# Patient Record
Sex: Male | Born: 1955 | Race: Black or African American | Hispanic: No | Marital: Married | Smoking: Former smoker
Health system: Southern US, Community
[De-identification: ages and names within clinical notes are randomized; demographics above are authoritative.]

## PROBLEM LIST (undated history)

## (undated) DIAGNOSIS — R011 Cardiac murmur, unspecified: Secondary | ICD-10-CM

## (undated) DIAGNOSIS — D649 Anemia, unspecified: Secondary | ICD-10-CM

## (undated) DIAGNOSIS — C61 Malignant neoplasm of prostate: Secondary | ICD-10-CM

## (undated) HISTORY — DX: Anemia, unspecified: D64.9

## (undated) HISTORY — PX: HERNIA REPAIR: SHX51

## (undated) HISTORY — DX: Malignant neoplasm of prostate: C61

---

## 2006-03-16 ENCOUNTER — Emergency Department: Payer: Self-pay | Admitting: Emergency Medicine

## 2006-07-24 ENCOUNTER — Emergency Department: Payer: Self-pay | Admitting: Emergency Medicine

## 2013-11-17 ENCOUNTER — Emergency Department: Payer: Self-pay | Admitting: Emergency Medicine

## 2013-11-17 LAB — BASIC METABOLIC PANEL
ANION GAP: 8 (ref 7–16)
BUN: 10 mg/dL (ref 7–18)
CALCIUM: 8.6 mg/dL (ref 8.5–10.1)
CO2: 26 mmol/L (ref 21–32)
Chloride: 107 mmol/L (ref 98–107)
Creatinine: 1.14 mg/dL (ref 0.60–1.30)
EGFR (African American): >60
EGFR (Non-African Amer.): >60
Glucose: 111 mg/dL — ABNORMAL HIGH (ref 65–99)
OSMOLALITY: 281 (ref 275–301)
POTASSIUM: 3.6 mmol/L (ref 3.5–5.1)
Sodium: 141 mmol/L (ref 136–145)

## 2013-11-17 LAB — CBC
HCT: 42.7 % (ref 40.0–52.0)
HGB: 14.2 g/dL (ref 13.0–18.0)
MCH: 31 pg (ref 26.0–34.0)
MCHC: 33.4 g/dL (ref 32.0–36.0)
MCV: 93 fL (ref 80–100)
PLATELETS: 264 10*3/uL (ref 150–440)
RBC: 4.59 10*6/uL (ref 4.40–5.90)
RDW: 13.5 % (ref 11.5–14.5)
WBC: 6.5 10*3/uL (ref 3.8–10.6)

## 2013-11-17 LAB — TROPONIN I: Troponin-I: <0.02 ng/mL

## 2013-11-18 LAB — D-DIMER(ARMC): D-DIMER: 552 ng/mL

## 2013-11-18 LAB — CK TOTAL AND CKMB (NOT AT ARMC)
CK, Total: 468 U/L — ABNORMAL HIGH
CK-MB: 5.7 ng/mL — AB (ref 0.5–3.6)

## 2013-11-18 LAB — TROPONIN I: Troponin-I: <0.02 ng/mL

## 2014-03-23 ENCOUNTER — Ambulatory Visit: Payer: Self-pay | Admitting: Internal Medicine

## 2014-04-21 ENCOUNTER — Inpatient Hospital Stay: Payer: Self-pay | Admitting: Internal Medicine

## 2014-04-21 LAB — COMPREHENSIVE METABOLIC PANEL
ALBUMIN: 2.4 g/dL — AB (ref 3.4–5.0)
ALK PHOS: 690 U/L — AB
ANION GAP: 7 (ref 7–16)
AST: 36 U/L (ref 15–37)
BILIRUBIN TOTAL: 0.2 mg/dL (ref 0.2–1.0)
BUN: 15 mg/dL (ref 7–18)
CO2: 29 mmol/L (ref 21–32)
Calcium, Total: 9.2 mg/dL (ref 8.5–10.1)
Chloride: 98 mmol/L (ref 98–107)
Creatinine: 0.82 mg/dL (ref 0.60–1.30)
EGFR (African American): >60
EGFR (Non-African Amer.): >60
Glucose: 119 mg/dL — ABNORMAL HIGH (ref 65–99)
Osmolality: 270 (ref 275–301)
Potassium: 4.3 mmol/L (ref 3.5–5.1)
SGPT (ALT): 44 U/L
SODIUM: 134 mmol/L — AB (ref 136–145)
Total Protein: 7.4 g/dL (ref 6.4–8.2)

## 2014-04-21 LAB — CBC WITH DIFFERENTIAL/PLATELET
BANDS NEUTROPHIL: 3 %
HCT: 31.6 % — AB (ref 40.0–52.0)
HGB: 10.3 g/dL — ABNORMAL LOW (ref 13.0–18.0)
Lymphocytes: 35 %
MCH: 28 pg (ref 26.0–34.0)
MCHC: 32.6 g/dL (ref 32.0–36.0)
MCV: 86 fL (ref 80–100)
Metamyelocyte: 2 %
Monocytes: 5 %
NRBC/100 WBC: 1 /
Platelet: 417 10*3/uL (ref 150–440)
RBC: 3.67 10*6/uL — ABNORMAL LOW (ref 4.40–5.90)
RDW: 14.5 % (ref 11.5–14.5)
Segmented Neutrophils: 55 %
WBC: 8.3 10*3/uL (ref 3.8–10.6)

## 2014-04-22 LAB — IRON AND TIBC
Iron Bind.Cap.(Total): 192 ug/dL — ABNORMAL LOW (ref 250–450)
Iron Saturation: 90 %
Iron: 173 ug/dL (ref 65–175)
Unbound Iron-Bind.Cap.: 19 ug/dL

## 2014-04-22 LAB — CBC WITH DIFFERENTIAL/PLATELET
Bands: 2 %
HCT: 30.2 % — ABNORMAL LOW (ref 40.0–52.0)
HGB: 10 g/dL — ABNORMAL LOW (ref 13.0–18.0)
LYMPHS PCT: 19 %
MCH: 28.5 pg (ref 26.0–34.0)
MCHC: 33 g/dL (ref 32.0–36.0)
MCV: 86 fL (ref 80–100)
Monocytes: 10 %
NRBC/100 WBC: 8 /
PLATELETS: 411 10*3/uL (ref 150–440)
RBC: 3.5 10*6/uL — ABNORMAL LOW (ref 4.40–5.90)
RDW: 14.7 % — AB (ref 11.5–14.5)
SEGMENTED NEUTROPHILS: 69 %
WBC: 8.5 10*3/uL (ref 3.8–10.6)

## 2014-04-22 LAB — PROTIME-INR
INR: 1.1
PROTHROMBIN TIME: 14 s (ref 11.5–14.7)

## 2014-04-22 LAB — RETICULOCYTES
Absolute Retic Count: 0.07 10*6/uL (ref 0.019–0.186)
Reticulocyte: 2 % (ref 0.4–3.1)

## 2014-04-22 LAB — APTT: Activated PTT: 33.8 secs (ref 23.6–35.9)

## 2014-04-22 LAB — FERRITIN: Ferritin (ARMC): 1612 ng/mL — ABNORMAL HIGH (ref 8–388)

## 2014-04-23 LAB — OCCULT BLOOD X 1 CARD TO LAB, STOOL: Occult Blood, Feces: NEGATIVE

## 2014-04-24 ENCOUNTER — Ambulatory Visit: Payer: Self-pay | Admitting: Radiation Oncology

## 2014-04-24 LAB — CBC WITH DIFFERENTIAL/PLATELET
BANDS NEUTROPHIL: 3 %
HCT: 32.3 % — AB (ref 40.0–52.0)
HGB: 10.5 g/dL — ABNORMAL LOW (ref 13.0–18.0)
Lymphocytes: 25 %
MCH: 28.3 pg (ref 26.0–34.0)
MCHC: 32.5 g/dL (ref 32.0–36.0)
MCV: 87 fL (ref 80–100)
Metamyelocyte: 3 %
Monocytes: 5 %
Myelocyte: 2 %
NRBC/100 WBC: 2 /
Platelet: 438 10*3/uL (ref 150–440)
RBC: 3.71 10*6/uL — AB (ref 4.40–5.90)
RDW: 15 % — ABNORMAL HIGH (ref 11.5–14.5)
Segmented Neutrophils: 62 %
WBC: 10.5 10*3/uL (ref 3.8–10.6)

## 2014-04-24 LAB — PSA: PSA: 4.9 ng/mL — ABNORMAL HIGH (ref 0.0–4.0)

## 2014-04-25 LAB — CBC WITH DIFFERENTIAL/PLATELET
HCT: 32.3 % — ABNORMAL LOW (ref 40.0–52.0)
HGB: 10.6 g/dL — AB (ref 13.0–18.0)
LYMPHS PCT: 17 %
MCH: 28.5 pg (ref 26.0–34.0)
MCHC: 32.8 g/dL (ref 32.0–36.0)
MCV: 87 fL (ref 80–100)
Metamyelocyte: 3 %
Monocytes: 11 %
Myelocyte: 2 %
NRBC/100 WBC: 3 /
Platelet: 421 10*3/uL (ref 150–440)
RBC: 3.72 10*6/uL — AB (ref 4.40–5.90)
RDW: 15.3 % — ABNORMAL HIGH (ref 11.5–14.5)
Segmented Neutrophils: 67 %
WBC: 10.8 10*3/uL — ABNORMAL HIGH (ref 3.8–10.6)

## 2014-04-26 LAB — CBC WITH DIFFERENTIAL/PLATELET
Eosinophil: 1 %
HCT: 31.5 % — ABNORMAL LOW (ref 40.0–52.0)
HGB: 10.3 g/dL — ABNORMAL LOW (ref 13.0–18.0)
Lymphocytes: 25 %
MCH: 28.6 pg (ref 26.0–34.0)
MCHC: 32.8 g/dL (ref 32.0–36.0)
MCV: 87 fL (ref 80–100)
MONOS PCT: 13 %
NRBC/100 WBC: 3 /
PLATELETS: 401 10*3/uL (ref 150–440)
RBC: 3.62 10*6/uL — AB (ref 4.40–5.90)
RDW: 15.6 % — ABNORMAL HIGH (ref 11.5–14.5)
Segmented Neutrophils: 61 %
WBC: 10.4 10*3/uL (ref 3.8–10.6)

## 2014-04-26 LAB — BASIC METABOLIC PANEL
ANION GAP: 6 — AB (ref 7–16)
BUN: 17 mg/dL (ref 7–18)
CO2: 32 mmol/L (ref 21–32)
Calcium, Total: 8.8 mg/dL (ref 8.5–10.1)
Chloride: 92 mmol/L — ABNORMAL LOW (ref 98–107)
Creatinine: 0.84 mg/dL (ref 0.60–1.30)
EGFR (African American): >60
Glucose: 128 mg/dL — ABNORMAL HIGH (ref 65–99)
Osmolality: 264 (ref 275–301)
POTASSIUM: 4.3 mmol/L (ref 3.5–5.1)
SODIUM: 130 mmol/L — AB (ref 136–145)

## 2014-04-27 LAB — BASIC METABOLIC PANEL
Anion Gap: 8 (ref 7–16)
BUN: 18 mg/dL (ref 7–18)
CO2: 29 mmol/L (ref 21–32)
CREATININE: 0.77 mg/dL (ref 0.60–1.30)
Calcium, Total: 8.6 mg/dL (ref 8.5–10.1)
Chloride: 95 mmol/L — ABNORMAL LOW (ref 98–107)
EGFR (Non-African Amer.): >60
Glucose: 141 mg/dL — ABNORMAL HIGH (ref 65–99)
OSMOLALITY: 269 (ref 275–301)
POTASSIUM: 4.5 mmol/L (ref 3.5–5.1)
SODIUM: 132 mmol/L — AB (ref 136–145)

## 2014-04-27 LAB — CBC WITH DIFFERENTIAL/PLATELET
BANDS NEUTROPHIL: 4 %
HCT: 31.9 % — AB (ref 40.0–52.0)
HGB: 10.6 g/dL — ABNORMAL LOW (ref 13.0–18.0)
Lymphocytes: 21 %
MCH: 28.6 pg (ref 26.0–34.0)
MCHC: 33.3 g/dL (ref 32.0–36.0)
MCV: 86 fL (ref 80–100)
MONOS PCT: 7 %
NRBC/100 WBC: 3 /
Platelet: 363 10*3/uL (ref 150–440)
RBC: 3.71 10*6/uL — AB (ref 4.40–5.90)
RDW: 15.7 % — ABNORMAL HIGH (ref 11.5–14.5)
SEGMENTED NEUTROPHILS: 68 %
WBC: 9.4 10*3/uL (ref 3.8–10.6)

## 2014-04-28 LAB — BASIC METABOLIC PANEL
ANION GAP: 6 — AB (ref 7–16)
BUN: 19 mg/dL — ABNORMAL HIGH (ref 7–18)
CREATININE: 0.82 mg/dL (ref 0.60–1.30)
Calcium, Total: 8.7 mg/dL (ref 8.5–10.1)
Chloride: 95 mmol/L — ABNORMAL LOW (ref 98–107)
Co2: 31 mmol/L (ref 21–32)
Glucose: 167 mg/dL — ABNORMAL HIGH (ref 65–99)
Osmolality: 271 (ref 275–301)
Potassium: 4.3 mmol/L (ref 3.5–5.1)
Sodium: 132 mmol/L — ABNORMAL LOW (ref 136–145)

## 2014-04-28 LAB — CBC WITH DIFFERENTIAL/PLATELET
BANDS NEUTROPHIL: 3 %
HCT: 31.8 % — ABNORMAL LOW (ref 40.0–52.0)
HGB: 10.5 g/dL — ABNORMAL LOW (ref 13.0–18.0)
LYMPHS PCT: 9 %
MCH: 28.9 pg (ref 26.0–34.0)
MCHC: 33.2 g/dL (ref 32.0–36.0)
MCV: 87 fL (ref 80–100)
METAMYELOCYTE: 4 %
MONOS PCT: 7 %
NRBC/100 WBC: 5 /
PLATELETS: 351 10*3/uL (ref 150–440)
RBC: 3.65 10*6/uL — AB (ref 4.40–5.90)
RDW: 15.8 % — ABNORMAL HIGH (ref 11.5–14.5)
SEGMENTED NEUTROPHILS: 77 %
WBC: 10.6 10*3/uL (ref 3.8–10.6)

## 2014-05-01 ENCOUNTER — Ambulatory Visit: Payer: Self-pay | Admitting: Radiation Oncology

## 2014-05-03 LAB — CBC CANCER CENTER
BASOS ABS: 0 x10 3/mm (ref 0.0–0.1)
BASOS PCT: 0.1 %
Eosinophil #: 0 x10 3/mm (ref 0.0–0.7)
Eosinophil %: 0.1 %
HCT: 32 % — ABNORMAL LOW (ref 40.0–52.0)
HGB: 10.6 g/dL — ABNORMAL LOW (ref 13.0–18.0)
LYMPHS PCT: 12.5 %
Lymphocyte #: 1.1 x10 3/mm (ref 1.0–3.6)
MCH: 28.6 pg (ref 26.0–34.0)
MCHC: 33.1 g/dL (ref 32.0–36.0)
MCV: 86 fL (ref 80–100)
MONOS PCT: 12.3 %
Monocyte #: 1.1 x10 3/mm — ABNORMAL HIGH (ref 0.2–1.0)
NEUTROS ABS: 6.8 x10 3/mm — AB (ref 1.4–6.5)
Neutrophil %: 75 %
Platelet: 295 x10 3/mm (ref 150–440)
RBC: 3.7 10*6/uL — AB (ref 4.40–5.90)
RDW: 16.3 % — AB (ref 11.5–14.5)
WBC: 9.1 x10 3/mm (ref 3.8–10.6)

## 2014-05-04 ENCOUNTER — Emergency Department: Payer: Self-pay | Admitting: Student

## 2014-05-12 ENCOUNTER — Emergency Department: Payer: Self-pay | Admitting: Emergency Medicine

## 2014-05-12 LAB — COMPREHENSIVE METABOLIC PANEL
Albumin: 2.5 g/dL — ABNORMAL LOW (ref 3.4–5.0)
Alkaline Phosphatase: 968 U/L — ABNORMAL HIGH
Anion Gap: 5 — ABNORMAL LOW (ref 7–16)
BILIRUBIN TOTAL: 0.3 mg/dL (ref 0.2–1.0)
BUN: 14 mg/dL (ref 7–18)
CALCIUM: 7.9 mg/dL — AB (ref 8.5–10.1)
Chloride: 99 mmol/L (ref 98–107)
Co2: 31 mmol/L (ref 21–32)
Creatinine: 0.84 mg/dL (ref 0.60–1.30)
EGFR (African American): >60
Glucose: 154 mg/dL — ABNORMAL HIGH (ref 65–99)
Osmolality: 274 (ref 275–301)
Potassium: 4.1 mmol/L (ref 3.5–5.1)
SGOT(AST): 53 U/L — ABNORMAL HIGH (ref 15–37)
SGPT (ALT): 38 U/L
Sodium: 135 mmol/L — ABNORMAL LOW (ref 136–145)
Total Protein: 6.9 g/dL (ref 6.4–8.2)

## 2014-05-12 LAB — CBC CANCER CENTER
BASOS ABS: 0 x10 3/mm (ref 0.0–0.1)
Basophil %: 0.4 %
Eosinophil #: 0 x10 3/mm (ref 0.0–0.7)
Eosinophil %: 0.2 %
HCT: 34 % — ABNORMAL LOW (ref 40.0–52.0)
HGB: 10.9 g/dL — AB (ref 13.0–18.0)
LYMPHS PCT: 11.6 %
Lymphocyte #: 0.7 x10 3/mm — ABNORMAL LOW (ref 1.0–3.6)
MCH: 28.2 pg (ref 26.0–34.0)
MCHC: 32 g/dL (ref 32.0–36.0)
MCV: 88 fL (ref 80–100)
MONO ABS: 0.6 x10 3/mm (ref 0.2–1.0)
MONOS PCT: 10.4 %
Neutrophil #: 4.4 x10 3/mm (ref 1.4–6.5)
Neutrophil %: 77.4 %
PLATELETS: 229 x10 3/mm (ref 150–440)
RBC: 3.86 10*6/uL — AB (ref 4.40–5.90)
RDW: 17.5 % — AB (ref 11.5–14.5)
WBC: 5.7 x10 3/mm (ref 3.8–10.6)

## 2014-05-12 LAB — CBC
HCT: 33.1 % — AB (ref 40.0–52.0)
HGB: 10.9 g/dL — ABNORMAL LOW (ref 13.0–18.0)
MCH: 29.1 pg (ref 26.0–34.0)
MCHC: 33 g/dL (ref 32.0–36.0)
MCV: 88 fL (ref 80–100)
Platelet: 217 10*3/uL (ref 150–440)
RBC: 3.75 10*6/uL — AB (ref 4.40–5.90)
RDW: 17.3 % — AB (ref 11.5–14.5)
WBC: 5.7 10*3/uL (ref 3.8–10.6)

## 2014-05-12 LAB — BASIC METABOLIC PANEL
Anion Gap: 5 — ABNORMAL LOW (ref 7–16)
BUN: 15 mg/dL (ref 7–18)
CALCIUM: 7.8 mg/dL — AB (ref 8.5–10.1)
CO2: 32 mmol/L (ref 21–32)
Chloride: 98 mmol/L (ref 98–107)
Creatinine: 0.82 mg/dL (ref 0.60–1.30)
EGFR (African American): >60
GLUCOSE: 143 mg/dL — AB (ref 65–99)
OSMOLALITY: 273 (ref 275–301)
Potassium: 4.3 mmol/L (ref 3.5–5.1)
Sodium: 135 mmol/L — ABNORMAL LOW (ref 136–145)

## 2014-05-12 LAB — IRON AND TIBC
Iron Bind.Cap.(Total): 209 ug/dL — ABNORMAL LOW (ref 250–450)
Iron Saturation: 46 %
Iron: 97 ug/dL (ref 65–175)
Unbound Iron-Bind.Cap.: 112 ug/dL

## 2014-05-12 LAB — TROPONIN I
Troponin-I: <0.02 ng/mL
Troponin-I: <0.02 ng/mL

## 2014-05-15 LAB — PSA: PSA: 4 ng/mL (ref 0.0–4.0)

## 2014-05-23 ENCOUNTER — Ambulatory Visit: Payer: Self-pay | Admitting: Radiation Oncology

## 2014-06-05 ENCOUNTER — Ambulatory Visit: Payer: Self-pay | Admitting: Surgery

## 2014-06-08 ENCOUNTER — Ambulatory Visit: Payer: Self-pay | Admitting: Surgery

## 2014-06-09 LAB — COMPREHENSIVE METABOLIC PANEL
ANION GAP: 9 (ref 7–16)
Albumin: 2.7 g/dL — ABNORMAL LOW (ref 3.4–5.0)
Alkaline Phosphatase: 1096 U/L — ABNORMAL HIGH
BUN: 15 mg/dL (ref 7–18)
Bilirubin,Total: 0.3 mg/dL (ref 0.2–1.0)
Calcium, Total: 7.9 mg/dL — ABNORMAL LOW (ref 8.5–10.1)
Chloride: 100 mmol/L (ref 98–107)
Co2: 26 mmol/L (ref 21–32)
Creatinine: 0.81 mg/dL (ref 0.60–1.30)
EGFR (African American): >60
EGFR (Non-African Amer.): >60
Glucose: 122 mg/dL — ABNORMAL HIGH (ref 65–99)
Osmolality: 272 (ref 275–301)
Potassium: 4.3 mmol/L (ref 3.5–5.1)
SGOT(AST): 49 U/L — ABNORMAL HIGH (ref 15–37)
SGPT (ALT): 73 U/L — ABNORMAL HIGH
Sodium: 135 mmol/L — ABNORMAL LOW (ref 136–145)
TOTAL PROTEIN: 6.3 g/dL — AB (ref 6.4–8.2)

## 2014-06-09 LAB — CBC CANCER CENTER
BASOS PCT: 0.2 %
Basophil #: 0 x10 3/mm (ref 0.0–0.1)
Eosinophil #: 0 x10 3/mm (ref 0.0–0.7)
Eosinophil %: 0.1 %
HCT: 30.9 % — ABNORMAL LOW (ref 40.0–52.0)
HGB: 10.5 g/dL — ABNORMAL LOW (ref 13.0–18.0)
LYMPHS PCT: 17.9 %
Lymphocyte #: 0.9 x10 3/mm — ABNORMAL LOW (ref 1.0–3.6)
MCH: 29.7 pg (ref 26.0–34.0)
MCHC: 33.8 g/dL (ref 32.0–36.0)
MCV: 88 fL (ref 80–100)
Monocyte #: 0.6 x10 3/mm (ref 0.2–1.0)
Monocyte %: 11.5 %
NEUTROS PCT: 70.3 %
Neutrophil #: 3.4 x10 3/mm (ref 1.4–6.5)
Platelet: 158 x10 3/mm (ref 150–440)
RBC: 3.52 10*6/uL — ABNORMAL LOW (ref 4.40–5.90)
RDW: 22.1 % — AB (ref 11.5–14.5)
WBC: 4.8 x10 3/mm (ref 3.8–10.6)

## 2014-06-12 LAB — PSA: PSA: 2.9 ng/mL (ref 0.0–4.0)

## 2014-06-19 LAB — CBC CANCER CENTER
Basophil #: 0 x10 3/mm (ref 0.0–0.1)
Basophil %: 0.3 %
EOS ABS: 0 x10 3/mm (ref 0.0–0.7)
EOS PCT: 0 %
HCT: 29.4 % — ABNORMAL LOW (ref 40.0–52.0)
HGB: 9.5 g/dL — ABNORMAL LOW (ref 13.0–18.0)
Lymphocyte #: 1.4 x10 3/mm (ref 1.0–3.6)
Lymphocyte %: 12.8 %
MCH: 28.7 pg (ref 26.0–34.0)
MCHC: 32.2 g/dL (ref 32.0–36.0)
MCV: 89 fL (ref 80–100)
Monocyte #: 1.2 x10 3/mm — ABNORMAL HIGH (ref 0.2–1.0)
Monocyte %: 10.5 %
NEUTROS PCT: 76.4 %
Neutrophil #: 8.5 x10 3/mm — ABNORMAL HIGH (ref 1.4–6.5)
PLATELETS: 112 x10 3/mm — AB (ref 150–440)
RBC: 3.29 10*6/uL — AB (ref 4.40–5.90)
RDW: 23.1 % — ABNORMAL HIGH (ref 11.5–14.5)
WBC: 11.1 x10 3/mm — ABNORMAL HIGH (ref 3.8–10.6)

## 2014-06-22 LAB — CBC CANCER CENTER
BASOS ABS: 0 x10 3/mm (ref 0.0–0.1)
Basophil %: 0.3 %
EOS ABS: 0 x10 3/mm (ref 0.0–0.7)
Eosinophil %: 0.1 %
HCT: 27.2 % — AB (ref 40.0–52.0)
HGB: 8.8 g/dL — ABNORMAL LOW (ref 13.0–18.0)
Lymphocyte #: 1.2 x10 3/mm (ref 1.0–3.6)
Lymphocyte %: 10.9 %
MCH: 29.1 pg (ref 26.0–34.0)
MCHC: 32.6 g/dL (ref 32.0–36.0)
MCV: 89 fL (ref 80–100)
MONO ABS: 0.8 x10 3/mm (ref 0.2–1.0)
Monocyte %: 7.5 %
Neutrophil #: 8.7 x10 3/mm — ABNORMAL HIGH (ref 1.4–6.5)
Neutrophil %: 81.2 %
Platelet: 90 x10 3/mm — ABNORMAL LOW (ref 150–440)
RBC: 3.04 10*6/uL — AB (ref 4.40–5.90)
RDW: 23.4 % — AB (ref 11.5–14.5)
WBC: 10.7 x10 3/mm — AB (ref 3.8–10.6)

## 2014-06-23 ENCOUNTER — Ambulatory Visit: Payer: Self-pay | Admitting: Radiation Oncology

## 2014-06-26 LAB — CBC CANCER CENTER
BASOS ABS: 0 x10 3/mm (ref 0.0–0.1)
Basophil %: 0.2 %
Eosinophil #: 0 x10 3/mm (ref 0.0–0.7)
Eosinophil %: 0 %
HCT: 27.7 % — ABNORMAL LOW (ref 40.0–52.0)
HGB: 8.8 g/dL — AB (ref 13.0–18.0)
Lymphocyte #: 1.2 x10 3/mm (ref 1.0–3.6)
Lymphocyte %: 8.5 %
MCH: 29.1 pg (ref 26.0–34.0)
MCHC: 32 g/dL (ref 32.0–36.0)
MCV: 91 fL (ref 80–100)
MONO ABS: 1.1 x10 3/mm — AB (ref 0.2–1.0)
Monocyte %: 7.4 %
NEUTROS PCT: 83.9 %
Neutrophil #: 12 x10 3/mm — ABNORMAL HIGH (ref 1.4–6.5)
PLATELETS: 115 x10 3/mm — AB (ref 150–440)
RBC: 3.04 10*6/uL — ABNORMAL LOW (ref 4.40–5.90)
RDW: 24.9 % — ABNORMAL HIGH (ref 11.5–14.5)
WBC: 14.3 x10 3/mm — ABNORMAL HIGH (ref 3.8–10.6)

## 2014-07-03 LAB — COMPREHENSIVE METABOLIC PANEL
ALT: 57 U/L
Albumin: 3 g/dL — ABNORMAL LOW (ref 3.4–5.0)
Alkaline Phosphatase: 965 U/L — ABNORMAL HIGH
Anion Gap: 7 (ref 7–16)
BUN: 12 mg/dL (ref 7–18)
Bilirubin,Total: 0.3 mg/dL (ref 0.2–1.0)
CHLORIDE: 103 mmol/L (ref 98–107)
CREATININE: 0.77 mg/dL (ref 0.60–1.30)
Calcium, Total: 7.8 mg/dL — ABNORMAL LOW (ref 8.5–10.1)
Co2: 29 mmol/L (ref 21–32)
EGFR (Non-African Amer.): >60
Glucose: 140 mg/dL — ABNORMAL HIGH (ref 65–99)
Osmolality: 280 (ref 275–301)
POTASSIUM: 4.1 mmol/L (ref 3.5–5.1)
SGOT(AST): 13 U/L — ABNORMAL LOW (ref 15–37)
Sodium: 139 mmol/L (ref 136–145)
TOTAL PROTEIN: 6.1 g/dL — AB (ref 6.4–8.2)

## 2014-07-03 LAB — CBC CANCER CENTER
BASOS ABS: 0.1 x10 3/mm (ref 0.0–0.1)
Basophil %: 0.8 %
Eosinophil #: 0 x10 3/mm (ref 0.0–0.7)
Eosinophil %: 0.2 %
HCT: 28.9 % — AB (ref 40.0–52.0)
HGB: 9.5 g/dL — ABNORMAL LOW (ref 13.0–18.0)
LYMPHS ABS: 0.9 x10 3/mm — AB (ref 1.0–3.6)
Lymphocyte %: 10.1 %
MCH: 30.6 pg (ref 26.0–34.0)
MCHC: 32.8 g/dL (ref 32.0–36.0)
MCV: 93 fL (ref 80–100)
Monocyte #: 0.6 x10 3/mm (ref 0.2–1.0)
Monocyte %: 6.7 %
Neutrophil #: 7.7 x10 3/mm — ABNORMAL HIGH (ref 1.4–6.5)
Neutrophil %: 82.2 %
PLATELETS: 188 x10 3/mm (ref 150–440)
RBC: 3.09 10*6/uL — ABNORMAL LOW (ref 4.40–5.90)
RDW: 26.4 % — AB (ref 11.5–14.5)
WBC: 9.3 x10 3/mm (ref 3.8–10.6)

## 2014-07-10 LAB — CBC CANCER CENTER
Basophil #: 0 x10 3/mm (ref 0.0–0.1)
Basophil %: 0.3 %
EOS ABS: 0 x10 3/mm (ref 0.0–0.7)
Eosinophil %: 0 %
HCT: 26.8 % — ABNORMAL LOW (ref 40.0–52.0)
HGB: 8.7 g/dL — ABNORMAL LOW (ref 13.0–18.0)
Lymphocyte #: 1.4 x10 3/mm (ref 1.0–3.6)
Lymphocyte %: 9.5 %
MCH: 30.3 pg (ref 26.0–34.0)
MCHC: 32.5 g/dL (ref 32.0–36.0)
MCV: 93 fL (ref 80–100)
Monocyte #: 1.5 x10 3/mm — ABNORMAL HIGH (ref 0.2–1.0)
Monocyte %: 10.4 %
NEUTROS PCT: 79.8 %
Neutrophil #: 11.7 x10 3/mm — ABNORMAL HIGH (ref 1.4–6.5)
PLATELETS: 159 x10 3/mm (ref 150–440)
RBC: 2.87 10*6/uL — ABNORMAL LOW (ref 4.40–5.90)
RDW: 25.7 % — AB (ref 11.5–14.5)
WBC: 14.7 x10 3/mm — ABNORMAL HIGH (ref 3.8–10.6)

## 2014-07-17 LAB — CBC CANCER CENTER
BANDS NEUTROPHIL: 13 %
HCT: 23.5 % — ABNORMAL LOW (ref 40.0–52.0)
HGB: 7.7 g/dL — ABNORMAL LOW (ref 13.0–18.0)
Lymphocytes: 14 %
MCH: 30.3 pg (ref 26.0–34.0)
MCHC: 33 g/dL (ref 32.0–36.0)
MCV: 92 fL (ref 80–100)
Metamyelocyte: 1 %
Monocytes: 6 %
Myelocyte: 2 %
NRBC/100 WBC: 11 /100
PLATELETS: 140 x10 3/mm — AB (ref 150–440)
RBC: 2.56 10*6/uL — ABNORMAL LOW (ref 4.40–5.90)
RDW: 24.6 % — ABNORMAL HIGH (ref 11.5–14.5)
Segmented Neutrophils: 64 %
WBC: 8.2 x10 3/mm (ref 3.8–10.6)

## 2014-07-24 ENCOUNTER — Ambulatory Visit: Payer: Self-pay | Admitting: Radiation Oncology

## 2014-07-24 LAB — COMPREHENSIVE METABOLIC PANEL
ANION GAP: 11 (ref 7–16)
Albumin: 2.5 g/dL — ABNORMAL LOW (ref 3.4–5.0)
Alkaline Phosphatase: 1658 U/L — ABNORMAL HIGH (ref 46–116)
BILIRUBIN TOTAL: 0.2 mg/dL (ref 0.2–1.0)
BUN: 8 mg/dL (ref 7–18)
CHLORIDE: 101 mmol/L (ref 98–107)
Calcium, Total: 7.3 mg/dL — ABNORMAL LOW (ref 8.5–10.1)
Co2: 23 mmol/L (ref 21–32)
Creatinine: 0.76 mg/dL (ref 0.60–1.30)
EGFR (African American): >60
Glucose: 133 mg/dL — ABNORMAL HIGH (ref 65–99)
OSMOLALITY: 270 (ref 275–301)
Potassium: 3.7 mmol/L (ref 3.5–5.1)
SGOT(AST): 20 U/L (ref 15–37)
SGPT (ALT): 25 U/L (ref 14–63)
Sodium: 135 mmol/L — ABNORMAL LOW (ref 136–145)
Total Protein: 6.2 g/dL — ABNORMAL LOW (ref 6.4–8.2)

## 2014-07-24 LAB — CBC CANCER CENTER
Basophil #: 0.1 x10 3/mm (ref 0.0–0.1)
Basophil %: 0.6 %
Eosinophil #: 0 x10 3/mm (ref 0.0–0.7)
Eosinophil %: 0.4 %
HCT: 21.9 % — ABNORMAL LOW (ref 40.0–52.0)
HGB: 7.2 g/dL — ABNORMAL LOW (ref 13.0–18.0)
Lymphocyte #: 1.2 x10 3/mm (ref 1.0–3.6)
Lymphocyte %: 12 %
MCH: 30.4 pg (ref 26.0–34.0)
MCHC: 32.7 g/dL (ref 32.0–36.0)
MCV: 93 fL (ref 80–100)
Monocyte #: 0.9 x10 3/mm (ref 0.2–1.0)
Monocyte %: 9.5 %
Neutrophil #: 7.5 x10 3/mm — ABNORMAL HIGH (ref 1.4–6.5)
Neutrophil %: 77.5 %
Platelet: 219 x10 3/mm (ref 150–440)
RBC: 2.36 10*6/uL — ABNORMAL LOW (ref 4.40–5.90)
RDW: 23.7 % — ABNORMAL HIGH (ref 11.5–14.5)
WBC: 9.6 x10 3/mm (ref 3.8–10.6)

## 2014-07-31 LAB — CBC CANCER CENTER
BANDS NEUTROPHIL: 16 %
Eosinophil: 1 %
HCT: 27.4 % — AB (ref 40.0–52.0)
HGB: 8.9 g/dL — AB (ref 13.0–18.0)
Lymphocytes: 7 %
MCH: 30.1 pg (ref 26.0–34.0)
MCHC: 32.5 g/dL (ref 32.0–36.0)
MCV: 93 fL (ref 80–100)
MONOS PCT: 9 %
Metamyelocyte: 5 %
Myelocyte: 3 %
NRBC/100 WBC: 2 /100
Platelet: 249 x10 3/mm (ref 150–440)
RBC: 2.96 10*6/uL — AB (ref 4.40–5.90)
RDW: 22.2 % — ABNORMAL HIGH (ref 11.5–14.5)
Segmented Neutrophils: 59 %
WBC: 20.9 x10 3/mm — ABNORMAL HIGH (ref 3.8–10.6)

## 2014-07-31 LAB — COMPREHENSIVE METABOLIC PANEL
ANION GAP: 8 (ref 7–16)
Albumin: 2.9 g/dL — ABNORMAL LOW (ref 3.4–5.0)
Alkaline Phosphatase: 3040 U/L — ABNORMAL HIGH (ref 46–116)
BUN: 8 mg/dL (ref 7–18)
Bilirubin,Total: 0.3 mg/dL (ref 0.2–1.0)
CALCIUM: 7.3 mg/dL — AB (ref 8.5–10.1)
CO2: 26 mmol/L (ref 21–32)
Chloride: 107 mmol/L (ref 98–107)
Creatinine: 0.85 mg/dL (ref 0.60–1.30)
EGFR (African American): >60
Glucose: 126 mg/dL — ABNORMAL HIGH (ref 65–99)
OSMOLALITY: 281 (ref 275–301)
Potassium: 4.3 mmol/L (ref 3.5–5.1)
SGOT(AST): 24 U/L (ref 15–37)
SGPT (ALT): 47 U/L (ref 14–63)
SODIUM: 141 mmol/L (ref 136–145)
Total Protein: 6.3 g/dL — ABNORMAL LOW (ref 6.4–8.2)

## 2014-08-22 ENCOUNTER — Ambulatory Visit
Admit: 2014-08-22 | Disposition: A | Discharge status: Home / Self Care | Payer: Self-pay | Attending: Internal Medicine | Admitting: Internal Medicine

## 2014-08-31 ENCOUNTER — Observation Stay: Payer: Self-pay | Admitting: Internal Medicine

## 2014-09-14 LAB — CBC CANCER CENTER
BANDS NEUTROPHIL: 8 %
HCT: 30.3 % — ABNORMAL LOW (ref 40.0–52.0)
HGB: 9.7 g/dL — AB (ref 13.0–18.0)
LYMPHS PCT: 9 %
MCH: 30.2 pg (ref 26.0–34.0)
MCHC: 31.8 g/dL — ABNORMAL LOW (ref 32.0–36.0)
MCV: 95 fL (ref 80–100)
METAMYELOCYTE: 1 %
MONOS PCT: 6 %
Platelet: 219 x10 3/mm (ref 150–440)
RBC: 3.19 10*6/uL — AB (ref 4.40–5.90)
RDW: 19.7 % — ABNORMAL HIGH (ref 11.5–14.5)
SEGMENTED NEUTROPHILS: 76 %
WBC: 27.5 x10 3/mm — AB (ref 3.8–10.6)

## 2014-09-21 ENCOUNTER — Emergency Department: Admit: 2014-09-21 | Disposition: A | Discharge status: Home / Self Care | Payer: Self-pay | Admitting: Internal Medicine

## 2014-09-21 LAB — CBC CANCER CENTER
Basophil #: 0 x10 3/mm (ref 0.0–0.1)
Basophil %: 0.1 %
EOS PCT: 0 %
Eosinophil #: 0 x10 3/mm (ref 0.0–0.7)
HCT: 28.2 % — ABNORMAL LOW (ref 40.0–52.0)
HGB: 9 g/dL — ABNORMAL LOW (ref 13.0–18.0)
Lymphocyte #: 1.5 x10 3/mm (ref 1.0–3.6)
Lymphocyte %: 8.5 %
MCH: 30.7 pg (ref 26.0–34.0)
MCHC: 32 g/dL (ref 32.0–36.0)
MCV: 96 fL (ref 80–100)
Monocyte #: 0.9 x10 3/mm (ref 0.2–1.0)
Monocyte %: 5.2 %
Neutrophil #: 15.3 x10 3/mm — ABNORMAL HIGH (ref 1.4–6.5)
Neutrophil %: 86.2 %
Platelet: 160 x10 3/mm (ref 150–440)
RBC: 2.94 10*6/uL — AB (ref 4.40–5.90)
RDW: 19.5 % — ABNORMAL HIGH (ref 11.5–14.5)
WBC: 17.8 x10 3/mm — AB (ref 3.8–10.6)

## 2014-09-21 LAB — POTASSIUM: POTASSIUM: 4.5 mmol/L

## 2014-09-21 LAB — PHOSPHORUS: PHOSPHORUS: 2.8 mg/dL

## 2014-09-21 LAB — CALCIUM: Calcium, Total: 7.6 mg/dL — ABNORMAL LOW

## 2014-09-21 LAB — CREATININE, SERUM
CREATININE: 0.52 mg/dL — AB
Creatine, Serum: 0.52

## 2014-09-22 ENCOUNTER — Ambulatory Visit
Admit: 2014-09-22 | Disposition: A | Discharge status: Home / Self Care | Payer: Self-pay | Attending: Internal Medicine | Admitting: Internal Medicine

## 2014-09-28 LAB — COMPREHENSIVE METABOLIC PANEL
ALBUMIN: 3.8 g/dL
ALT: 24 U/L
AST: 22 U/L
Alkaline Phosphatase: 998 U/L — ABNORMAL HIGH
Anion Gap: 8 (ref 7–16)
BUN: 13 mg/dL
Bilirubin,Total: 0.4 mg/dL
Calcium, Total: 7.9 mg/dL — ABNORMAL LOW
Chloride: 107 mmol/L
Co2: 22 mmol/L
Creatinine: 0.6 mg/dL — ABNORMAL LOW
EGFR (African American): >60
EGFR (Non-African Amer.): >60
GLUCOSE: 197 mg/dL — AB
POTASSIUM: 4.2 mmol/L
SODIUM: 137 mmol/L
Total Protein: 6.6 g/dL

## 2014-09-28 LAB — CBC CANCER CENTER
BASOS ABS: 0 x10 3/mm (ref 0.0–0.1)
Basophil %: 0.3 %
Eosinophil #: 0 x10 3/mm (ref 0.0–0.7)
Eosinophil %: 0.2 %
HCT: 29.6 % — ABNORMAL LOW (ref 40.0–52.0)
HGB: 9.5 g/dL — ABNORMAL LOW (ref 13.0–18.0)
LYMPHS PCT: 9 %
Lymphocyte #: 0.9 x10 3/mm — ABNORMAL LOW (ref 1.0–3.6)
MCH: 30.9 pg (ref 26.0–34.0)
MCHC: 32.2 g/dL (ref 32.0–36.0)
MCV: 96 fL (ref 80–100)
Monocyte #: 0.3 x10 3/mm (ref 0.2–1.0)
Monocyte %: 2.9 %
NEUTROS PCT: 87.6 %
Neutrophil #: 8.9 x10 3/mm — ABNORMAL HIGH (ref 1.4–6.5)
Platelet: 230 x10 3/mm (ref 150–440)
RBC: 3.09 10*6/uL — ABNORMAL LOW (ref 4.40–5.90)
RDW: 19.7 % — AB (ref 11.5–14.5)
WBC: 10.2 x10 3/mm (ref 3.8–10.6)

## 2014-10-12 LAB — CBC CANCER CENTER
BASOS ABS: 0 x10 3/mm (ref 0.0–0.1)
BASOS PCT: 0.3 %
Eosinophil #: 0 x10 3/mm (ref 0.0–0.7)
Eosinophil %: 0 %
HCT: 29.9 % — AB (ref 40.0–52.0)
HGB: 9.5 g/dL — AB (ref 13.0–18.0)
Lymphocyte #: 0.8 x10 3/mm — ABNORMAL LOW (ref 1.0–3.6)
Lymphocyte %: 7.5 %
MCH: 30.4 pg (ref 26.0–34.0)
MCHC: 31.6 g/dL — AB (ref 32.0–36.0)
MCV: 96 fL (ref 80–100)
MONO ABS: 0.6 x10 3/mm (ref 0.2–1.0)
Monocyte %: 5.2 %
Neutrophil #: 9.7 x10 3/mm — ABNORMAL HIGH (ref 1.4–6.5)
Neutrophil %: 87 %
Platelet: 203 x10 3/mm (ref 150–440)
RBC: 3.12 10*6/uL — ABNORMAL LOW (ref 4.40–5.90)
RDW: 19.6 % — ABNORMAL HIGH (ref 11.5–14.5)
WBC: 11.1 x10 3/mm — ABNORMAL HIGH (ref 3.8–10.6)

## 2014-10-12 LAB — COMPREHENSIVE METABOLIC PANEL
ALK PHOS: 942 U/L — AB
ALT: 28 U/L
ANION GAP: 6 — AB (ref 7–16)
Albumin: 3.7 g/dL
BUN: 18 mg/dL
Bilirubin,Total: 0.5 mg/dL
CALCIUM: 8.3 mg/dL — AB
CHLORIDE: 107 mmol/L
CO2: 25 mmol/L
Creatinine: 0.79 mg/dL
EGFR (African American): >60
EGFR (Non-African Amer.): >60
GLUCOSE: 120 mg/dL — AB
Potassium: 3.9 mmol/L
SGOT(AST): 17 U/L
SODIUM: 138 mmol/L
Total Protein: 6.6 g/dL

## 2014-10-14 NOTE — Consult Note (Signed)
   Comments   Pt back from radiation and currently in no pain. Still on hydromorphone drip. Plan to start conversion from PCA to home regimen tomorrow AM.  Electronic Signatures: Mackenzy Eisenberg, Judith Part (NP)  (Signed [phone] 15:29)  Authored: Palliative Care Phifer, Izora Gala (MD)  (Signed [phone] 17:01)  Authored: Palliative Care   Last Updated: 05-Nov-15 17:01 by Phifer, Izora Gala (MD)

## 2014-10-14 NOTE — Op Note (Signed)
PATIENT NAME:  Anthony [name], Anthony [name] MR#:  [id number] DATE OF BIRTH:  1955-11-06  DATE OF PROCEDURE:  06/08/2014  PREOPERATIVE DIAGNOSIS:  Prostate cancer.   POSTOPERATIVE DIAGNOSIS:  Prostate cancer.   PROCEDURE:  Insertion of central venous catheter with subcutaneous infusion port.   SURGEON:  Loreli Dollar, MD   ANESTHESIA:  Local 1% Xylocaine followed by Xylocaine with epinephrine with monitored anesthesia care.   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   DESCRIPTION OF PROCEDURE:  The patient was placed on the operating table in the supine position under intravenous sedation and was monitored by the anesthesia staff. A rolled sheet was placed behind the shoulder blades so that the neck was extended and the head was turned some 20 degrees to the left. The neck was examined with ultrasound. I identified a large patent right internal jugular vein adjacent to the carotid artery. Next, the neck and right subclavian areas were prepared with ChloraPrep and draped in a sterile manner.   The skin beneath the clavicle was infiltrated with 1% Xylocaine. A transversely-oriented 3 cm incision was made and carried down through the subcutaneous tissues. Numerous small bleeding points were cauterized. A subcutaneous pouch was created just anterior to the deep fascia, large enough to admit the Worthington port. Several additional small bleeding points were cauterized. The tissues were infiltrated with Xylocaine with epinephrine for better hemostasis.   The jugular vein was further demonstrated with ultrasound, and the skin overlying the jugular vein was infiltrated with 1% Xylocaine with epinephrine. A lancing 5 mm incision was made, oriented transversely, and I used a hemostat to dissect down into the subcutaneous tissues. Next, with the ultrasound guidance, a needle was inserted into the  internal jugular vein. A guidewire was inserted into the central circulation. An ultrasound image was saved for the paper chart. Next, fluoroscopy was used to demonstrate the location of the guidewire in the vena cava. The dilator and introducer sheath were advanced over the guidewire. The guidewire and dilator were removed. The catheter was advanced down through the sheath, and the sheath was peeled away. The tip of the catheter was positioned in the superior vena cava at 15 cm from the skin incision. This was demonstrated with fluoroscopy, and a fluoroscopic image was saved for the paper chart. Next, I noted that there was some bleeding from the site, and the patient was placed in reverse Trendelenburg position. Next, the catheter was tunneled down to the subclavian incision, and pressure was held over the catheter site. Next, the catheter was cut to fit and attached to the Hemlock port using the accompanying sleeve to secure it. Next, the wound was further inspected and determined hemostasis was intact. The port was placed into the subcutaneous pouch and was accessed with a Huber needle, aspirated a trace of blood, and flushed with 10 mL of heparinized saline solution. Next, the port was secured to the deep fascia with 4-0 silk suture. The wounds were further inspected and determined hemostasis was intact. The subcutaneous tissues were closed with interrupted 5-0 Vicryl. Both skin incisions were closed with 5-0 Vicryl subcuticular suture and LiquiBand.   The patient appeared to tolerate the procedure satisfactorily and was carried to the operating room in the reverse Trendelenburg position.     ____________________________ Lenna Sciara. Rochel Brome, MD jws:nb D: 06/08/2014 12:49:28 ET T: 06/08/2014 23:24:29 ET JOB#: [id number]  cc: Loreli Dollar,  MD, <Dictator> Loreli Dollar MD ELECTRONICALLY SIGNED 06/09/2014 9:28

## 2014-10-14 NOTE — Consult Note (Signed)
PATIENT NAME:  Anthony [name], Anthony [name] MR#:  [id number] DATE OF BIRTH:  [birth year]  DATE OF CONSULTATION:  04/22/2014  REFERRING PHYSICIAN:   CONSULTING PHYSICIAN:  Simonne Come. Inez Pilgrim, MD  HISTORY OF PRESENT ILLNESS: Anthony [name] is a 59 year old patient who was admitted on October 30th. I saw and evaluated him on October 31st. A note was placed on the chart, communicated with primary medicine and orders entered. Narrative is delayed and rendered at this time.   Anthony [name] was admitted with back pain, MRI evidence and CT evidence of diffuse cancer in the pelvic lymph nodes and in the back, epidural disease below the conus, leg weakness from right nerve root compression. His history started a few months ago. Has had a 30 pound weight loss and appetite loss over a few months. He has had progressive back pain over a month or two. He has seen a chiropractor for manipulations. He recently saw primary doctor, Dr. Ginette Pitman, this was 4 days prior to admission, he had a PSA that was 5.7, and he was put on cyclobenzaprine and prednisone 40 mg. The patient's symptoms got worse and he was sent for an MRI, which shows extensive disease. He was hospitalized for intravenous narcotics for intractable pain and for evaluation and further treatment of the findings.   PAST MEDICAL HISTORY: Negative. His recent history includes the elevated PSA and back pain. He has had waxing and waning shoulder pain also and orthopedics has seen him this hospitalization. His severe pain in the last few months has been low back and right sacroiliac and he has some pain radiating down the sciatic fashion. He has had some weakness in flexion of the right hip.   MEDICATIONS: Recently prednisone and cyclobenzaprine. Otherwise, he has been on aspirin 325 daily, vitamin D weekly, Norco recently p.r.n. for pain and Aleve p.r.n. for pain.   SYSTEM REVIEW: On my evaluation, the patient when seen, otherwise had no headache or dizziness. No chills or  sweats. He has not had any coughing or wheezing or shortness of breath. No abdominal pain, nausea, vomiting, diarrhea, but he has admitted to poor appetite and weight loss. He has had no dysuria or hematuria or significant urinary frequency. The back and bone pain as described, also shoulder pain recently. No edema. No rash or bruising. He has had weakness of the right leg, particularly in flexing the right hip. He has had no other focal weakness. No polyuria or polydipsia.   PHYSICAL EXAMINATION:  GENERAL: He is alert and cooperative. He was in pain.  VITAL SIGNS: Stable.  HEENT: Sclerae no jaundice. Mouth: No thrush.  LYMPHATICS: No palpable lymph nodes in the neck, supraclavicular, submandibular or axilla.  LUNGS: Clear with decreased air entry. No wheezing or rales.  ABDOMEN: Nontender. No palpable mass or organomegaly.  EXTREMITIES: No extremity edema. NEUROLOGICAL: Alert and cooperative. Cranial nerves intact. Full movement and full strength and movement in the upper extremities. The right leg with some effort dependent weakness but he also has weakness in flexion of the hip.   ADMISSION LABORATORY STUDIES: White count was 8.3, hemoglobin was 10, platelets were 417,000. Reportedly a PSA recently was 5.7, that was not available initially. The creatinine was 0.82 on admission. Calcium was 9.2, alkaline phosphatase 690. SGOT and SGPT normal. Albumin of 2.4. Hemoglobin was 10.3, white count 8.3, platelets 417,000.   CT scan of the chest, abdomen and pelvis performed, showing unremarkable chest except for diffuse bony metastatic disease. CT of abdomen and pelvis showed some cysts  in the left kidney, significant retroperitoneal adenopathy, the aorta is displaced anteriorly by adenopathy that is 4.8 cm in greatest dimension. Adenopathy is into the left iliac node chain but not into the right. The MRI of the lumbar spine, shows diffuse abnormal signal throughout the lumbosacral spine. Retroperitoneal  adenopathy noted. He has epidural extension of soft tissue mass from the L3 vertebral body. There is mass effect on the right intraspinal L4 nerve root and severe spinal stenosis at L3. There is mild epidural extension of tumor from the posterior margin of L1 vertebral body with flattening of the ventral thecal sac. There is no evidence of cord compression.   IMPRESSION AND PLAN: The patient has severe retroperitoneal left iliac adenopathy and diffuse bony metastatic disease including epidural extension disease, but no spinal cord compression. Also, the patient's weakness in the right leg is already somewhat improved after a dose of steroid on admission. He still had significant pain on IV morphine and pain medicines are adjusted. He has an anemia, with a hemoglobin of 10 grams, compatible with advanced cancer. He has iron studies that show a very high ferritin of 1600, iron saturation of 90, which could be spurious due to hemolysis or incidentally discovered hemochromatosis. This should be  reviewed at a later time. Alkaline phosphatase is very high consistent with the bony metastatic disease. Calcium and creatinine are okay.   PLAN: PSA was repeated. If the patient has a very high PSA, then he would not need a tissue diagnosis, this would be established prostate cancer, but otherwise to consider prostate cancer or other histologies such as lymphoma or undifferentiated cancer, the patient would need tissue biopsy. Therefore, we will review the outstanding PSA and consult invasive radiology for site, looks like multiple sites for bone or lymph node would be amenable to biopsy. The baseline PT and PTT is okay. With regard to pain, initially titrating by continuing IV morphine p.r.n., stop Percocet, start on oxycodone more frequently p.r.n., added Tylenol around the clock, started Decadron for pain and for nerve root compression 4 mg 3 times a day. Can titrate narcotics p.r.n. If needed, can add Toradol or  nonsteroidals. Treatment depends on histology. If we confirm prostate cancer, would start Casodex as initial treatment and the Lupron. We will consult radiation/oncology also.    ____________________________ Simonne Come. Inez Pilgrim, MD rgg:TT D: 04/23/2014 15:32:00 ET T: 04/23/2014 20:28:27 ET JOB#: [id number]  cc: Simonne Come. Inez Pilgrim, MD, <Dictator> Dallas Schimke MD ELECTRONICALLY SIGNED 05/28/2014 22:01

## 2014-10-14 NOTE — Consult Note (Signed)
Reason for Visit: This 59 year old [name] patient presents to the clinic for initial evaluation of  skeletal metastasis .   Referred by Dr. Cynda Acres.  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 He has significant pain in his lower extremities and significant disease in his lumbar spine and SI joints for palliative radiation to those regions.  Pathology Report pathology report pending biopsy this week   Imaging Report CT scans MRI scans reviewed bone scan ordered   Referral Report clinical notes reviewed   Planned Treatment Regimen prior radiation therapy to lumbar spine and SI joints   HPI   patient is a 59 year old [name] admitted to [facility] with increasing low back pain radicular type pain in his right lower extremity and a 30 pound weight loss over the past several months. He been seeing a chiropractor for treatment had a PSA5.7. MRI scan showed extensive disease of his lumbar spine and SI joints consistent with metastatic disease. He has been treated with narcotic analgesics with some good pain relief. I've been asked to evaluate the patient for possible palliative radiation therapy. He has no evidence of cord compression does have some extension of diseaseinto the epidural space from soft tissue mass at the L3 vertebral body. There is also mass effect on the right intraspinal L4 nerve root. CT scan showed a constellation of finding showing bony metastatic disease significant left pelvic and retroperitoneal adenopathy suggestive of prostate cancer. Bone scan confirmed widespread metastatic disease of the skeletal axis.  Allergies:   No Known Allergies:   Other Results:  Radiology Results: LabUnknown:    30-Oct-15 20:10, CT Chest, Abd, and Pelvis With Contrast  PACS Image     02-Nov-15 14:01, Bone Scan Whole Body (Part 2 of 2)  PACS Image   MRI:    30-Oct-15  16:01, MRI Lumbar Spine WWO  MRI Lumbar Spine WWO   REASON FOR EXAM:    Low back pain w radiculopathy  COMMENTS:       PROCEDURE: MMR - MMR LUMBAR SPINE WO/W  - Apr 21 2014  4:01PM     CLINICAL DATA:  Right leg pain, low back pain, numbness, tingling    EXAM:  MRI LUMBAR SPINE WITHOUT AND WITH CONTRAST    TECHNIQUE:  Multiplanar and multiecho pulse sequences of the lumbar spine were  obtained without and with intravenous contrast.    CONTRAST:  18 mL MultiHance  COMPARISON:  None.    FINDINGS:  The vertebral bodies of the lumbar spine are normal in size. The  vertebral bodies of the lumbar spine are normal in alignment. The  intervertebral disc spaces are well-maintained.    There is diffuse abnormal signal throughout the lumbosacral spine  and visualized ilium most consistent with metastatic disease. There  is retroperitoneal lymphadenopathy with the largest left para-aortic  lymph node measuring 3.9 cm in short axis. There is an enlarged left  iliac chain lymph node measuring 2.8 cm in short axis. There is mild  epidural extension of tumor from the posterior margin of the L1  vertebral body with flattening of the ventral thecal sac. There is  epidural extension of soft tissue mass from the L2 vertebral body  narrowing the lateral recesses bilaterally.    There is epidural extension of soft tissue mass from the L3  vertebral body with a central/right paracentral predominance which  extends into  the right infra pedicular aspect of the L3-4 foramen.  There is mass effect on the right intraspinal L4 nerve root. There  is severe spinal stenosis focally at L3. There is extraosseous  extension of soft tissue mass from the right L3 pedicle posteriorly  tracking along the lamina.    The spinal cord is normal in signal and contour. The cord terminates  normally at L1 . The nerve roots of the cauda equina and the filum  terminale are normal.    The visualized portions of the SI  joints are unremarkable.  The imaged intra-abdominal contents are unremarkable.    T12-L1: No significant disc bulge. No evidence of neural foraminal  stenosis. No central canal stenosis.    L1-L2: No significant disc bulge. No evidence of neural foraminal  stenosis. No central canal stenosis.    L2-L3: No significant disc bulge. No evidence of neural foraminal  stenosis. No central canal stenosis.    L3-L4: No significant disc bulge. There is right infra pedicular  foraminal stenosis resulting from extraosseous extension of soft  tissue mass from the right L3 pedicle. There is severe spinal  stenosis focally at L3 resulting from epidural extension oftumor  from the L3 vertebral body.    L4-L5: Mild broad-based disc osteophyte complex. No evidence of  neural foraminal stenosis. No central canal stenosis.    L5-S1: No significant disc bulge. No evidence of neural foraminal  stenosis. No central canal stenosis.     IMPRESSION:  1. Diffuse abnormal marrow lesions throughout the lower thoracic and  lumbosacral spine as well as the visualized ilium consistent with  metastatic disease. There is extensive retroperitoneal and left  iliac chain lymphadenopathy. The overall appearances most consistent  with metastatic disease. Primary consideration would be for  metastatic prostate cancer given the distribution. Correlate with  PSA.  2. There is epidural extension of soft tissue mass from the L3  vertebral body with a central/right paracentral predominance which  extends into the right infra pedicular aspect of the L3-4 foramen.  There is mass effect on the right intraspinal L4 nerve root. There  is severe spinal stenosis focally at L3. Oncology consultation is  recommended.  3. There is epidural extension of soft tissue mass from the L2  vertebral body and to a lesser degree the L1 vertebral body without  spinal canal compromise.  These results were called by telephone at the time of  interpretation  on 04/21/2014 at 4:37 pm to [facility], who verbally acknowledged  these results.    Electronically Signed    By: Kathreen Devoid    On: 04/21/2014 16:38         Verified By: Jennette Banker, M.D., MD  CT:    30-Oct-15 20:10, CT Chest, Abd, and Pelvis With Contrast  CT Chest, Abd, and Pelvis With Contrast   REASON FOR EXAM:    (1) metastatic cancer; (2) metastatic cancer  COMMENTS:       PROCEDURE: CT  - CT CHEST ABDOMEN AND PELVIS W  - Apr 21 2014  8:10PM     CLINICAL DATA:  Abnormal MRI with extensive metastatic bony disease  and lymphadenopathy    EXAM:  CT CHEST, ABDOMEN, AND PELVIS WITH CONTRAST    TECHNIQUE:  Multidetector CT imaging of the chest, abdomen and pelvis was  performed following the standard protocol during bolus  administration of intravenous contrast.  CONTRAST:  100 mL Isovue-300    COMPARISON:  None.    FINDINGS:  CT CHEST FINDINGS    The lungs are well aerated bilaterally without evidence of focal  infiltrate or sizable effusion. No parenchymal nodules are seen. The  thyroid is within normal limits. The thoracic aorta and pulmonary  artery as visualized are unremarkable. No hilar or mediastinal  adenopathy is noted. The osseous structures however show significant  bony metastatic disease similar to that seen on recent MRI of the  lumbar spine.  CT ABDOMEN AND PELVIS FINDINGS    The liver, gallbladder, spleen, adrenal glands and pancreas are all  normal in their CT appearance. The kidneys demonstrate a normal  enhancement pattern with the exception of a few cysts in the left  kidney. The largest of these measures 3.6 cm in greatest dimension.  No renal calculi or urinary tract obstructive changes are noted.    Significant retroperitoneal lymphadenopathy is identified in a  periaortic and inter aortocaval regions. The adenopathy displaces  the aorta anteriorly and measures 4.8 x 3.5 cm in greatest dimension  but extends  throughout the entire retroperitoneum and into the left  iliac nodal chain. No significant right-sided adenopathy is noted.    The appendix is well visualized and within normal limits. No focal  colonic abnormality is seen. No obstructive changes are noted. The  bladder is well distended. The prostate indents upon the bladder  mildly. A few small pelvic lymph nodes are identified in the  internal iliac chain. The bony structures again demonstrate diffuse  metastatic disease similar to that seen on the recent MRI  examination. Evaluation of these lesions is that is performed on  that exam.     IMPRESSION:  Constellation of findings with bony metastatic disease and  significant left pelvic and retroperitoneal adenopathy suggestive of  prostate carcinoma. Correlation with patient's PSA is recommended.      Electronically Signed    By: Inez Catalina M.D.    On: 04/21/2014 20:22         Verified By: Everlene Farrier, M.D.,  Nuclear Med:    [phone] 14:01, Bone Scan Whole Body (Part 2 of 2)  Bone Scan Whole Body (Part 2 of 2)   REASON FOR EXAM:    part 2  COMMENTS:       PROCEDURE: NM  - NM BONE WB 3 HR 2 OF 2  - Apr 24 2014  2:01PM     CLINICAL DATA:  Osseous metastatic disease.    EXAM:  NUCLEAR MEDICINE WHOLE BODY BONE SCAN    TECHNIQUE:  Whole body anterior and posterior images were obtained approximately  3 hours after intravenous injection of radiopharmaceutical.    RADIOPHARMACEUTICALS:  21.638 mCi Technetium-99 MDP  COMPARISON:  CT scan of April 21, 2014.    FINDINGS:  Multiple foci of abnormal uptake are noted throughout the spine,  ribs, and pelvis. Abnormal uptake is also seen involving both  glenoid regions, left greater than right, consistent with metastatic  disease. Lesions are also noted in the sternum. No areas of abnormal  uptake are noted in theskull or long bones.     IMPRESSION:  Multiple foci of abnormal uptake are seen involving the spine,  ribs  and pelvis consistent with metastatic disease.      Electronically Signed    By: Sabino Dick M.D.    On: 04/24/2014 14:13         Verified JG:OTLXB Blanchie Serve, M.D.,   Relevent Results:   Relevant Scans and Labs MRI scans, bone scan, CT  scans all reviewed   Assessment and Plan: Impression:   widespread metastatic diseasemostly confined to skeletal sites as well as para aortic lymphadenopathy in patient with elevated PSA most likely prostate cancer. Patient to have biopsy-of para-aortic nodes later this week for palliative radiation therapy to his L spine and SI joints Plan:   at this time O head with palliative radiation therapy to his lumbar spine and SI joints. Would treat both areas to 33,000 cGy in 15 fractions. Would use a hyperfractionated course sinceIof the significant amount of small bowel and colon in her treatment fields. I will start treatment planning tomorrow awaiting tissue information to begin actual treatments. Risks and benefits of treatment including possible diarrhea dysuria S fatigue alteration of blood counts were explained in detail to the patient. I have set him up and ordered CT simulation tomorrow.  would like to take this opportunity for allowing me to participate in the care of yourpatient..  Fax to Physician:  Physicians To Recieve Fax:    Tama High, MD - [phone] Rusty Aus, MD - [phone].  Electronic Signatures: Armstead Peaks (MD)  (Signed [phone] 15:16)  Authored: HPI, Diagnosis, Allergies, Home Meds, Other Results, Relevent Results, Encounter Assessment and Plan, Fax to Physician   Last Updated: 02-Nov-15 15:16 by Armstead Peaks (MD)

## 2014-10-14 NOTE — Consult Note (Signed)
Chief Complaint:  Subjective/Chief Complaint Pain better when seen earlier   VITAL SIGNS/ANCILLARY NOTES: **Vital Signs.:   02-Nov-15 15:58  Vital Signs Type Routine  Temperature Temperature (F) 97.6  Celsius 36.4  Temperature Source oral  Pulse Pulse 106  Respirations Respirations 20  Systolic BP Systolic BP 782  Diastolic BP (mmHg) Diastolic BP (mmHg) 75  Mean BP 90  Pulse Ox % Pulse Ox % 100  Pulse Ox Activity Level  At rest  Oxygen Delivery Room Air/ 21 %   Brief Assessment:  GEN no acute distress   Cardiac Regular   Respiratory normal resp effort   Lab Results: Routine Chem:  02-Nov-15 05:52   Result Comment nrbc - SLIDE PREVIOUSLY REVIEWED BY PATHOLOGIST  Result(s) reported on 24 Apr 2014 at 06:38AM.  Routine Hem:  02-Nov-15 05:52   WBC (CBC) 10.5  RBC (CBC)  3.71  Hemoglobin (CBC)  10.5  Hematocrit (CBC)  32.3  Platelet Count (CBC) 438  MCV 87  MCH 28.3  MCHC 32.5  RDW  15.0  Bands 3  Segmented Neutrophils 62  Lymphocytes 25  Monocytes 5  Metamyelocyte 3  Myelocyte 2  NRBC 2  Diff Comment 1 ANISOCYTOSIS  Diff Comment 2 POLYCHROMASIA  Diff Comment 3 PLTS VARIED IN SIZE  Result(s) reported on 24 Apr 2014 at 06:38AM.   Assessment/Plan:  Assessment/Plan:  Assessment diffuse retroperitoneal adenopathy, bone disease,, epidural disease, no spinal cord compression...psa only minimally elevated, scans most consistent with prostate casncer,would be sa poorly differentiated and poor prosnostic variety, other histologies, lymphoma, in thr differental   Plan Discussed with interventional radiology earlier todasy, scan was reviewed , recommended best site for bx left sided retroperitoneal node, core bx, for flow cytometry and histology   Electronic Signatures: Dallas Schimke (MD)  (Signed [phone] 23:56)  Authored: Chief Complaint, VITAL SIGNS/ANCILLARY NOTES, Brief Assessment, Lab Results, Assessment/Plan   Last Updated: 02-Nov-15 23:56 by Dallas Schimke (MD)

## 2014-10-14 NOTE — Consult Note (Signed)
Brief Consult Note: Diagnosis: Left shoulder pain likely bursitis/rotator cuff tendonitis.   Patient was seen by consultant.   Consult note dictated.   Recommend further assessment or treatment.   Comments: Patient's shoulder pain likely due to inflammation (tendonitis/bursitis).  Recommend a NSAID or steroid taper if not medically contra-indicated.  Also recommend PT evaluation and treatment.  If he does not improve on this treatment regimen, an MRI would be warranted to determine if he has a rotator cuff tear.  Electronic Signatures: Thornton Park (MD)  (Signed 31-Oct-15 15:31)  Authored: Brief Consult Note   Last Updated: 31-Oct-15 15:31 by Thornton Park (MD)

## 2014-10-14 NOTE — Consult Note (Signed)
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   Patient was seen by consultant.   Comments: DICTATED NOTE TO FOLLOW... 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 PSA PENDING. HX PSA ELEVATION BUT LEVEL UNKNOWN.  NO SPINAL CORD COMPRESSION, THERE IS EPIDURAL DISEASE BELOW THE CONUS AND NERVE ROOT INVOLVEMENT.  SYMPTOMS IMPROVED SINCE YETERDAY.  IF VERY HIGH PSA DO NOT NEED TISSUE BX..WOULD THEN START CASODEX 50 MG PO DAILY, 2 WEEKS LATER START LUPRON. OTHERWISE, WOULD W/U FOR LYMPHOMA, MYELOMA, UNDIFFERENTIATED CANCER.  CONSULT RADIATION ONCOLOGY IN AM. PAIN MEDS ADJUSTED. IF NOT WELL CONTROLLED, TITRATE PRN, ALSO TORADOL MAY BE VERY EFFECTIVE FOR BONE PAIN.Marland Kitchen ALSO NOTED HIGH IRON SAT AND FERRITIN, MAY BE DUE TO HEMOLYSIS AND ACUTE PHASE REACTANT, OR INCIDENTIALLY DISCIVERED HEMOCHROMATOSIS...CAN LATER REPEAT IRON STUDIES AND GENETIC TESTING PRN.  Electronic Signatures: Dallas Schimke (MD)  (Signed 31-Oct-15 14:31)  Authored: Brief Consult Note   Last Updated: 31-Oct-15 14:31 by Dallas Schimke (MD)

## 2014-10-14 NOTE — Consult Note (Signed)
PATIENT NAME:  Anthony [name], Anthony [name] MR#:  [id number] DATE OF BIRTH:  [birth year]  DATE OF CONSULTATION:  04/21/2014  REFERRING PHYSICIAN:   CONSULTING PHYSICIAN:  Timoteo Gaul, MD  ORTHOPEDIC CONSULT  REASON FOR CONSULTATION: Left shoulder pain.   HISTORY OF PRESENT ILLNESS: Anthony [name] is a 59 year old male who was admitted with progressive low back pain. He has a history of an elevated PSA and a 30 pound weight loss over the past several months according to the H and P. The patient's pain has been steadily progressive and is no longer controlled with narcotic medication. He received an MRI, revealing metastatic lesions throughout the lumbar vertebrae, with a lesion that seems to be within the spinal canal. He is admitted to the medical service for pain control. I am consulted for the patient's complaints of left shoulder pain.   PAST MEDICAL HISTORY INCLUDES: History of elevated PSA, history of right-sided back pain and right-sided sciatica.   MEDICATIONS INCLUDE: Aspirin 325 mg daily, Vitamin D 50,000 units p.o. q. week, Norco 5/325 mg 1 tablet p.o. q. 6 hours p.r.n. for pain, and Aleve 240 mg p.o. b.i.d. p.r.n. for pain.   ALLERGIES: No known drug allergies.   PHYSICAL EXAMINATION:  The patient is lying supine in bed. His daughter is at the bedside. The patient states he is having left shoulder pain, but is able to forward elevate the arm up over his head. He can abduct the arm easily to 90 degrees with discomfort. In 90 degrees of passive abduction, he can externally rotate to 90 degrees and internally rotate to 60 degrees with mild discomfort. He had no weakness to manual strength testing of the rotator cuff, but he did have pain during this portion of the exam. He has intact sensation throughout the left upper extremity and a palpable radial pulse. He had no tenderness over the anterior or lateral shoulder, nor did he have tenderness over the [facility] joint. There is no step-off at the [facility]  joint. He had mild tenderness over the posterior shoulder joint. There is no muscle atrophy. There is no asymmetry or abnormality seen.   RADIOLOGY: X-ray films of the left shoulder were reviewed. These films demonstrate mild degenerative change at the [facility] joint and within the glenohumeral joint, without fracture, dislocation, or other osseous abnormality. There is no superior migration to the humeral head.   ASSESSMENT: Left shoulder pain, likely secondary to impingement, with underlying rotator cuff tendinitis and subacromial bursitis.   PLAN: Anthony [name] did not give a history of trauma, but did feel a pop in the left shoulder when pushing himself up out of bed a few days ago. That, he believes was when the pain started. He has excellent range of motion and strength, which makes a rotator cuff tear less likely, although not impossible. I would recommend treatment for shoulder impingement with either a nonsteroidal anti-inflammatory or an oral steroid taper. I would also recommend that he be evaluated and treated by the physical therapy service for left upper extremity range of motion and strengthening. An MRI would be considered if he does not show improvement on this treatment regimen over the next several days. I will defer to the medical service about starting oral medications, as NSAIDs may be contraindicated if there was a biopsy procedure pending. I also will defer to the medical service as to whether or not a steroid taper is appropriate, given his medical condition.   The patient may follow up with me on a p.r.n.  basis in my office, if his symptoms of left shoulder pain do not improve.    ____________________________ Timoteo Gaul, MD klk:MT D: 04/22/2014 14:36:39 ET T: 04/23/2014 05:32:36 ET JOB#: [id number]  cc: Timoteo Gaul, MD, <Dictator> Timoteo Gaul MD ELECTRONICALLY SIGNED 05/10/2014 8:07

## 2014-10-14 NOTE — H&P (Signed)
PATIENT NAME:  Anthony [name], Anthony [name] MR#:  [id number] DATE OF BIRTH:  Nov 21, 1955  DATE OF ADMISSION:  04/21/2014  CHIEF COMPLAINT:  Back pain, leg pain.   HISTORY OF PRESENT ILLNESS: This is a 59 year old male with a history of elevated PSA, who has had about a 30 pound weight loss over the last several months, with progressive back pain. It has been dating back to about August, per his and his wife's report. 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 It has now gotten to the point that it is not controlled with narcotic pain medication. He is referred for MRI to further investigate. MRI reveals what appeared to be metastatic lesions throughout the lumbar vertebrae with an additional lesion that appears to be growing into the spinal cavity. He is admitted now for pain control as well as for further evaluation for what appears to be metastatic cancer.   PAST MEDICAL HISTORY: 1.  History of elevated PSA.  2.  History of right-sided back pain with right right-sided sciatic symptoms.   ALLERGIES: NO KNOWN DRUG ALLERGIES.   MEDICATIONS: 1.  Aspirin 325 mg p.o. daily.  2.  Vitamin D 50,000 units p.o. q. week.  3.  Norco 5/325 one p.o. q. 6 hours p.r.n. pain.  4.  Aleve 240 mg p.o. b.i.d. p.r.n. pain.   REVIEW OF SYSTEMS: Please see HPI, the remainder of complete review of systems is negative.   PHYSICAL EXAMINATION: VITAL SIGNS: Temperature 97.8, pulse 109, blood pressure 125/73, saturation 98% on room air.  GENERAL: Well-developed, well-nourished male, in no distress.  EYES: Pupils are round, react to light. Lids and conjunctivae are unremarkable. Extraocular movement examination unremarkable; the oropharynx is moist without lesions.  NECK: Trachea in the midline. No thyromegaly.  CARDIOVASCULAR: Regular rate and rhythm without gallops or rubs.  LUNGS: Clear bilaterally without wheeze or retractions.  ABDOMEN: Soft, nontender,  positive bowel sounds.  EXTREMITIES: No clubbing, cyanosis, edema.  NEUROLOGIC: Pain with movement of the right leg, discomfort when he tries to stand.   DATA: White count 8.3, hemoglobin 10, platelets 417,000, MRI of the lumbar spine with and without contrast reveals diffuse abnormal marrow lesions throughout the lower thoracic lumbosacral spine as well as the visualized ileum, with extensive retroperitoneal and left iliac chain lymphadenopathy. There was epidural extension of soft tissue mass from the L3 vertebral body with right paracentral predominance into the right interpedicular aspect of L3-L4 foramen, with mass effect on L4 nerve root and severe spinal stenosis. There is additionally epidural extension of soft tissue from L2 and L1 into spinal canal.   IMPRESSION AND PLAN: 1.  Metastatic cancer with spinal stenosis. We will place on Decadron, pain meds, and ask oncology to see the patient; setting up CT of chest, abdomen and pelvis to further investigate as he will need biopsy; discussed plan with patient and his wife; further therapy and treatment plan to be based upon the above.  2.  Anemia likely related to what appears to be metastatic disease. We will follow his CBC. Pantoprazole added. Avoid anticoagulants for now for this as well as for potential need for biopsy.    ____________________________ Adin Hector, MD bjk:nt D: 04/21/2014 18:45:01 ET T: 04/21/2014 19:18:18 ET JOB#: [id number]  cc: Adin Hector, MD, <Dictator> Ramonita Lab MD ELECTRONICALLY SIGNED 04/25/2014 8:26

## 2014-10-14 NOTE — Discharge Summary (Signed)
PATIENT NAME:  Anthony [name], Anthony [name] MR#:  [id number] DATE OF BIRTH:  September 25, 1955  DATE OF ADMISSION:  04/21/2014 DATE OF DISCHARGE:  04/21/2014  DIAGNOSES AT THE TIME OF DISCHARGE: Metastatic disease to the vertebrae with spinal stenosis, and a history of elevated PSA.   CHIEF COMPLAINT: Back pain and leg pain.   HISTORY OF PRESENT ILLNESS: Pellegrino Kennard is a 59 year old male with a history of elevated PSA who had lost about 30 pounds in weight, and also been complaining of progressive back pain. 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 An MRI of his lumbar spine showed evidence for metastatic disease with diffuse abnormal marrow signal throughout the lower thoracic AND lumbosacral spine as well as visualized ileum, consistent with metastatic disease. There was also extensive retroperitoneal and a left iliac chain lymphadenopathy. There was epidural extension of soft tissue mass from the L3 vertebral body, with a central/right paracentral predominance, which extends into the right intrapedicular aspect of the L3 to L4 foramen. There was mass effect in the right intraspinal L4 nerve root. There was evidence of severe spinal stenosis focally at L3. There was also epidural extension of soft tissue mass from the L2 vertebral body to a lesser extent, and to a lesser degree the L1 vertebral body, without spinal canal compromise.   The patient was admitted to [facility]. He was also seen by oncologist, Dr. Barbette Reichmann. He subsequently underwent a biopsy of the retroperitoneal adenopathy under CT guidance; the results of which were still pending. The patient received radiation therapy while in the hospital. He continued to be in pain, especially pain in the low back, as well as right shoulder, and was seen by orthopedic Dr. Mack Guise and also seen by pain management with palliative care. He was started on a PCA pump and was subsequently switched to hydromorphone and a fentanyl  patch. The patient's pain came under control with p.o. medications, and he was discharged on the following medications.  DISCHARGE MEDICATIONS: Vitamin D3, 50,000 units once a week, cyclobenzaprine 10 mg q. 8 hours p.r.n., docusate sodium 100 mg p.o. b.i.d., gabapentin 300 mg once a day at bedtime, metoprolol 12.5 mg p.o. b.i.d., dexamethasone 4 mg 1 tablet every 8 hours, fentanyl patch 100 mcg topically every 3 days, Dilaudid  2 mg every 6 hours p.r.n. for pain, and Benadryl 25 mg p.o. t.i.d. for itching.   FOLLOWUP: The patient has been advised to follow up with Dr. Inez Pilgrim and also follow with radiation oncology and follow with me, Dr. Ginette Pitman, in 1-2 weeks' time. He has been advised to call the office with any questions or concerns.   The patient was stable at the time of discharge.   TOTAL TIME SPENT IN DISCHARGING THE PATIENT: 35 minutes    ____________________________ Tracie Harrier, MD vh:MT D: 04/29/2014 11:54:31 ET T: 04/29/2014 13:04:56 ET JOB#: [id number]  cc: Tracie Harrier, MD, <Dictator> Tracie Harrier MD ELECTRONICALLY SIGNED 05/23/2014 17:47

## 2014-10-16 LAB — SURGICAL PATHOLOGY

## 2014-10-18 LAB — CBC CANCER CENTER
BASOS PCT: 0.9 %
Basophil #: 0.1 x10 3/mm (ref 0.0–0.1)
Eosinophil #: 0 x10 3/mm (ref 0.0–0.7)
Eosinophil %: 0.1 %
HCT: 32.4 % — ABNORMAL LOW (ref 40.0–52.0)
HGB: 10.6 g/dL — ABNORMAL LOW (ref 13.0–18.0)
LYMPHS ABS: 0.7 x10 3/mm — AB (ref 1.0–3.6)
LYMPHS PCT: 9.6 %
MCH: 31.2 pg (ref 26.0–34.0)
MCHC: 32.6 g/dL (ref 32.0–36.0)
MCV: 96 fL (ref 80–100)
MONO ABS: 0.2 x10 3/mm (ref 0.2–1.0)
Monocyte %: 2.5 %
NEUTROS ABS: 6.6 x10 3/mm — AB (ref 1.4–6.5)
Neutrophil %: 86.9 %
Platelet: 209 x10 3/mm (ref 150–440)
RBC: 3.39 10*6/uL — ABNORMAL LOW (ref 4.40–5.90)
RDW: 19.6 % — AB (ref 11.5–14.5)
WBC: 7.6 x10 3/mm (ref 3.8–10.6)

## 2014-10-18 LAB — BASIC METABOLIC PANEL
Anion Gap: 3 — ABNORMAL LOW (ref 7–16)
BUN: 17 mg/dL
CO2: 26 mmol/L
Calcium, Total: 8.5 mg/dL — ABNORMAL LOW
Chloride: 105 mmol/L
Creatinine: 0.72 mg/dL
EGFR (African American): >60
Glucose: 171 mg/dL — ABNORMAL HIGH
Potassium: 4 mmol/L
SODIUM: 134 mmol/L — AB

## 2014-10-22 NOTE — Discharge Summary (Signed)
PATIENT NAME:  Anthony [name], Anthony [name] MR#:  [id number] DATE OF BIRTH:  1956/02/19  DATE OF ADMISSION:  08/31/2014 DATE OF DISCHARGE:    DIAGNOSES AT TIME OF DISCHARGE:   1. Supraventricular tachycardia.  2. Hypotension.  3. Metastatic prostate cancer.  4. Chronic pain.   CHIEF COMPLAINT: Palpitations.   HISTORY OF PRESENT ILLNESS: Anthony [name] is a 59 year old African-American male with a history of metastatic prostate cancer undergoing chemotherapy at the cancer center, presented to the ED with palpitations. The patient also reported a sense of weakness and was noted to be hypotensive with a systolic blood pressure in the 80s. The patient was cardioverted with 100 joules and subsequently reverted to sinus rhythm.   PAST MEDICAL HISTORY:  1.  Significant for prostate cancer with diffuse metastasis to the bone, currently on chemotherapy for the cancer.  2.  Hypertension.  3.  Chronic pain with narcotic pain medications.   HOSPITAL COURSE:  Please see H and P for full details. The patient was admitted to [facility] and he received metoprolol, his blood pressure was still low and he therefore needed fluid boluses on 2 different occasions. The patient was also seen in consultation by cardiologist, Dr. Nehemiah Massed and during his stay in the hospital the patient did not have any further episodes of SVT. His heart rate was better controlled. He was also seen by physical therapy and was stable at the time of discharge.   LABORATORY STUDIES ON DISCHARGE: Showed glucose of 92, BUN less than 5, creatinine 0.51. Total protein of 5.3, albumin 3.1. Hemoglobin 8 grams, WBC count 3.1, platelet count was 209,000. Echocardiogram showed EF of 60%-65%, normal global LV systolic function. Chest x-ray showed low lung volumes with no evidence of acute cardiopulmonary disease. Stool for Clostridium difficile was negative. The patient did have some dysuria and urinalysis was also negative and overall was stable at the time of  discharge.   DISCHARGE MEDICATIONS: Metoprolol tartrate 50 mg q. 12, Ensure 240 mg t.i.d. with meals, venlafaxine 37.5 mg once a day, MiraLax twice a week as needed, docusate sodium t.i.d. as needed, Loratadine 10 mg once a day during chemotherapy, aspirin 81 mg a day, gabapentin 300 mg once a day, fentanyl patch every 72 hours, dexamethasone 4 mg 2 tablets once a day, Dilaudid 2 mg once a day, calcium gluconate 5 mg p.o. b.i.d., ranitidine 150 mg p.o. b.i.d., and Benadryl as needed for itching.   FOLLOWUP: The patient was advised home health nursing, advised to follow with me, Dr. Ginette Pitman, in 2 weeks.   TOTAL TIME SPENT ON DISCHARGING PATIENT: 35 minutes.    ____________________________ Tracie Harrier, MD vh:bu D: 09/04/2014 12:43:05 ET T: 09/04/2014 13:00:19 ET JOB#: [id number]  cc: Tracie Harrier, MD, <Dictator> Tracie Harrier MD ELECTRONICALLY SIGNED 10/05/2014 21:14

## 2014-10-22 NOTE — Consult Note (Signed)
PATIENT NAME:  Anthony [name], BAUSERMAN MR#:  [id number] DATE OF BIRTH:  [birth year]  DATE OF CONSULTATION:  09/01/2014  REFERRING PHYSICIAN:   CONSULTING PHYSICIAN:  Corey Skains, MD  CONSULTING PHYSICIAN:  Dr. Ginette Pitman  REASON FOR CONSULTATION: Supraventricular tachycardia with shortness of breath and chest pain.  HISTORY OF PRESENT ILLNESS:   This is a 59 year old male with metastatic prostate cancer who has had some significant nutrition and dehydration issues. The patient had a sudden onset of palpitations, tachycardia, shortness of breath and chest pain and was seen in the emergency room. At that time, he had supraventricular tachycardia at 150 beats per minute and causing his symptoms. He underwent electrical cardioversion to normal sinus rhythm with relief of all of his symptoms. He now is weak and fatigued, most consistent with cancer, dehydration as well and concerns for and poor nutrition but this is improving as well. Recent echocardiogram has shown normal LV systolic function,  ejection fraction of 65% but no evidence of valvular heart disease.   Current EKG shows normal sinus rhythm,  no EKG changes, and there has been no evidence of myocardial infarction. He is anemic which also may cause symptoms listed above.    REVIEW OF SYSTEMS:  Difficult due to  patient's weakness and fatigue.   PAST MEDICAL HISTORY:   1.  Metastatic prostate cancer.  2.  Anemia.   FAMILY HISTORY: No family members with early onset of cardiovascular disease or hypertension.   SOCIAL HISTORY: Currently denies alcohol or tobacco use.     ALLERGIES:  He has allergy to medications as listed.   PHYSICAL EXAMINATION: VITAL SIGNS: Blood pressure is an 82/60 bilaterally. Heart rate is 66 upright, reclining, and regular.   GENERAL: He is a well appearing male in no acute distress.  HEAD, EYES, EARS, NOSE AND THROAT:   No icterus, thyromegaly, ulcers, or hemorrhages.  CARDIOVASCULAR: Regular rate and rhythm. Normal  S1 and S2 without murmur, gallop, or rub. PMI is diffuse.  Carotid upstroke normal without bruit. Jugular venous pressure is normal.  LUNGS: Clear to auscultation with normal respirations.  ABDOMEN: Soft, nontender but increased abdominal girth. Cannot assess hepatosplenomegaly.  EXTREMITIES: Show 2+ radial, femoral, dorsal pedal pulses, with trace lower extremity edema. No cyanosis, clubbing or ulcers.  NEUROLOGIC: He is oriented to time, place, and person, with normal mood and affect.   ASSESSMENT: A 59 year old male with anemia and acute onset of supraventricular tachycardia with rapid ventricular rate, chest pain, and shortness of breath, now resolved without evidence of myocardial infarction.   RECOMMENDATIONS: 1.  Metoprolol 12.5 mg twice per day for further risk reduction of supraventricular tachycardia.  2.  No further cardiac intervention at this time.  3.  Continue to correct current causes including dehydration and poor nutrition.     ____________________________ Corey Skains, MD bjk:tr D: 09/01/2014 15:52:58 ET T: 09/01/2014 16:03:00 ET JOB#: [id number]  cc: Corey Skains, MD, <Dictator> Corey Skains MD ELECTRONICALLY SIGNED 09/07/2014 8:09

## 2014-10-22 NOTE — H&P (Signed)
PATIENT NAME:  Anthony [name], Anthony [name] MR#:  [id number] DATE OF BIRTH:  [birth year]  DATE OF ADMISSION:  08/31/2014  PRIMARY CARE PHYSICIAN:  Dr. Ginette Pitman.    ONCOLOGIST: Dr. Inez Pilgrim.   CHIEF COMPLAINT: Palpitations today.   HISTORY OF PRESENT ILLNESS:  Anthony [name] is a 59 year old African-American gentleman with history of metastatic prostate cancer who currently is on chemotherapy at the cancer center, comes after he started having palpitations around lunchtime, felt very weak, came to the Emergency Room with his wife, was found to have SVT on EKG, his heart rate was 177. He was hypotensive with systolic blood pressure in the 80s. The patient received electrocardioversion with 100 joules and the patient currently is sinus rhythm. He is feeling weak and internal medicine was consulted for overnight observation and monitoring heart rate.   PAST MEDICAL HISTORY:  1.  Prostate cancer diffusely metastatic to the bone, currently on chemotherapy through the cancer center.  2.  Hypertension.  3.  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   FAMILY HISTORY: Positive for congestive heart failure.   SOCIAL HISTORY: Married, lives at home. Smoking, quit 6 months ago.   ALLERGIES: No known drug allergies.   CURRENT MEDICATIONS:  1. Venlafaxine 37.5 mg extended release at bedtime.  2. Ranitidine 150 mg b.i.d.  3. MiraLax 17 grams p.o. b.i.d.  4. Metoprolol 25 mg half a tablet b.i.d.  5. Loratadine 10 mg daily.  6. Gabapentin 300 mg at bedtime.  7. Fentanyl patch 150 mcg every 72 hours.  8. Docusate 100 mg t.i.d.  9. Dilaudid 2 mg 1 tablet once a day.  10. Dexamethasone 4 mg 2 tablets once a day at bedtime.  11. Calcium gluconate 500 mg 1 tablet b.i.d.  12. Aspirin 81 mg daily.  13. Benadryl 25 mg t.i.d.   REVIEW OF SYSTEMS:  CONSTITUTIONAL: No fever. Positive for fatigue, weakness.  EYES: No blurry or double vision, glaucoma, or cataract.  EARS, NOSE, AND  THROAT: No tinnitus, ear pain, hearing loss, or epistaxis.  RESPIRATORY: No cough, wheeze, hemoptysis, or COPD.   CARDIOVASCULAR: No chest pain. Positive for arrhythmia. Positive for weakness.  GASTROINTESTINAL: No nausea, vomiting, diarrhea, abdominal pain. No GERD.  GENITOURINARY: No dysuria, hematuria, or frequency.  ENDOCRINE: No polyuria, nocturia, or thyroid problems.  HEMATOLOGY: No anemia or easy bruising or bleeding.  SKIN: No acne or rash.  MUSCULOSKELETAL:  Chronic bone pain secondary to cancer.  NEUROLOGIC:  No CVA, TIA, ataxia, or dementia.  PSYCHIATRIC: No anxiety, insomnia, or bipolar disorder, depression.   All other systems reviewed and negative.   PHYSICAL EXAMINATION:  GENERAL: The patient is awake, alert, oriented x 3, not in acute distress.  VITAL SIGNS: Afebrile, pulse is 92, sinus rhythm, blood pressure is 91/64, saturation is 100% on room air.  HEENT: Atraumatic, normocephalic. Pupils PERRLA.  EOM intact.  Oral mucosa is moist.  NECK: Supple. No JVD. No carotid bruit.  LUNGS: Clear to auscultation bilaterally. No rales, rhonchi, respiratory distress, or labored breathing.  HEART:  Both the heart sounds are normal. Rate, rhythm regular. PMI not lateralized. Chest nontender.  EXTREMITIES: Good pedal pulses, good femoral pulses. No lower extremity edema.  ABDOMEN: Soft, benign, nontender. No organomegaly. Positive bowel sounds.  NEUROLOGIC: Grossly intact cranial nerves II through XII. No motor or sensory deficit.  PSYCHIATRIC: The patient is awake, alert, oriented x 3.   Chest x-ray shows low lung volumes, no evidence of acute cardiopulmonary disease. Troponin is less than 0.03. H  and H is 9.7 and 29.6. Glucose 145, rest of the chemistry is within normal limits. Calcium is 8.3. White count is 6.1. Initial EKG showed SVT, heart rate 177, repeat EKG after cardioversion showed normal sinus rhythm.   ASSESSMENT: A 59 year old, Anthony [name], with history of prostate  cancer, comes in with:    1.  Supraventricular tachycardia with hypotension. The patient will be admitted for overnight observation on telemetry floor. We will continue IV fluids. We will resume his metoprolol 12.5 mg b.i.d. The patient did receive electric cardioversion in the Emergency Room with 100 joules since he was hypertensive. He is back to sinus rhythm. Continue aspirin.  2.  Relative hypotension. We will resume beta blockers with holding parameters.  3.  Metastatic prostate cancer. The patient currently is undergoing chemotherapy at the cancer center, he follows up with Dr. Inez Pilgrim.   4.  Deep vein thrombosis prophylaxis. Subcutaneous heparin t.i.d.  5.  Code status, full code.   Admission plans were discussed with the patient and the patient's wife. Further workup according to the patient's clinical course.   TIME SPENT: 50 minutes.    ____________________________ Hart Rochester Posey Pronto, MD sap:bu D: 08/31/2014 20:10:31 ET T: 08/31/2014 20:44:46 ET JOB#: [id number]  cc: Cylie Dor A. Posey Pronto, MD, <Dictator> Tracie Harrier, MD Simonne Come. Inez Pilgrim, MD Ilda Basset MD ELECTRONICALLY SIGNED 09/03/2014 18:22

## 2014-10-25 ENCOUNTER — Inpatient Hospital Stay: Payer: BLUE CROSS/BLUE SHIELD | Attending: Internal Medicine

## 2014-10-25 DIAGNOSIS — Z79899 Other long term (current) drug therapy: Secondary | ICD-10-CM | POA: Diagnosis not present

## 2014-10-25 DIAGNOSIS — R3911 Hesitancy of micturition: Secondary | ICD-10-CM | POA: Insufficient documentation

## 2014-10-25 DIAGNOSIS — R232 Flushing: Secondary | ICD-10-CM | POA: Diagnosis not present

## 2014-10-25 DIAGNOSIS — C772 Secondary and unspecified malignant neoplasm of intra-abdominal lymph nodes: Secondary | ICD-10-CM | POA: Insufficient documentation

## 2014-10-25 DIAGNOSIS — Z923 Personal history of irradiation: Secondary | ICD-10-CM | POA: Insufficient documentation

## 2014-10-25 DIAGNOSIS — Z7982 Long term (current) use of aspirin: Secondary | ICD-10-CM | POA: Insufficient documentation

## 2014-10-25 DIAGNOSIS — Z7952 Long term (current) use of systemic steroids: Secondary | ICD-10-CM | POA: Insufficient documentation

## 2014-10-25 DIAGNOSIS — C61 Malignant neoplasm of prostate: Secondary | ICD-10-CM | POA: Diagnosis present

## 2014-10-25 DIAGNOSIS — C7951 Secondary malignant neoplasm of bone: Secondary | ICD-10-CM | POA: Insufficient documentation

## 2014-10-25 DIAGNOSIS — Z79818 Long term (current) use of other agents affecting estrogen receptors and estrogen levels: Secondary | ICD-10-CM | POA: Diagnosis not present

## 2014-10-27 LAB — CBC WITH DIFFERENTIAL/PLATELET
BAND NEUTROPHILS: 14 % — AB (ref 0–10)
BLASTS: 0 % — AB
Basophils Absolute: 0.2 10*3/uL — ABNORMAL HIGH (ref 0.0–0.1)
Basophils Relative: 1 % (ref 0–1)
Eosinophils Absolute: 0.2 10*3/uL (ref 0.0–0.7)
Eosinophils Relative: 1 % (ref 0–5)
HEMATOCRIT: 32.7 % — AB (ref 40.0–52.0)
HEMOGLOBIN: 10.5 g/dL — AB (ref 13.0–18.0)
LYMPHS ABS: 2.4 10*3/uL (ref 0.7–4.0)
Lymphocytes Relative: 11 % — ABNORMAL LOW (ref 12–46)
MCH: 30.7 pg (ref 26.0–34.0)
MCHC: 32.1 g/dL (ref 32.0–36.0)
MCV: 95.7 fL (ref 80.0–100.0)
MONO ABS: 1.7 10*3/uL — AB (ref 0.1–1.0)
Metamyelocytes Relative: 4 %
Monocytes Relative: 8 % (ref 3–12)
Myelocytes: 3 %
NRBC: 0 /100{WBCs}
Neutro Abs: 17 10*3/uL — ABNORMAL HIGH (ref 1.7–7.7)
Neutrophils Relative %: 58 % (ref 43–77)
Platelets: 232 10*3/uL (ref 150–440)
Promyelocytes Absolute: 0 %
RBC: 3.42 MIL/uL — ABNORMAL LOW (ref 4.40–5.90)
RDW: 19.4 % — ABNORMAL HIGH (ref 11.5–14.5)
WBC: 21.5 10*3/uL — ABNORMAL HIGH (ref 3.8–10.6)

## 2014-10-30 ENCOUNTER — Other Ambulatory Visit: Payer: Self-pay | Admitting: *Deleted

## 2014-10-30 NOTE — Telephone Encounter (Signed)
Received refill request fax from [facility]. Dr. Inez Pilgrim signed request with notation for the [facility] to be used and request faxed in to pharmacy.Marland KitchenMarland KitchenMarland Kitchen

## 2014-10-30 NOTE — Telephone Encounter (Signed)
Needs Calcium 500mg  refilled and needs Barnabas Lister to pay for it

## 2014-10-31 ENCOUNTER — Other Ambulatory Visit: Payer: Self-pay | Admitting: *Deleted

## 2014-10-31 MED ORDER — PREDNISONE 5 MG PO TABS: 5.0000 mg | ORAL_TABLET | Freq: Two times a day (BID) | ORAL | Status: DC

## 2014-10-31 MED ORDER — FENTANYL 50 MCG/HR TD PT72: 50.0000 ug | MEDICATED_PATCH | TRANSDERMAL | Status: DC

## 2014-10-31 MED ORDER — METOPROLOL TARTRATE 50 MG PO TABS: 50.0000 mg | ORAL_TABLET | Freq: Two times a day (BID) | ORAL | Status: DC

## 2014-10-31 MED ORDER — GABAPENTIN 300 MG PO CAPS: 300.0000 mg | ORAL_CAPSULE | Freq: Three times a day (TID) | ORAL | Status: DC

## 2014-10-31 NOTE — Telephone Encounter (Signed)
Needs reefill on prednisone, metoprolol, gabapentin, and fentanyl 50. Wants to pick up rx for fentanyl tomorrow

## 2014-11-01 ENCOUNTER — Inpatient Hospital Stay: Payer: BLUE CROSS/BLUE SHIELD

## 2014-11-01 ENCOUNTER — Other Ambulatory Visit: Payer: Self-pay | Admitting: *Deleted

## 2014-11-01 DIAGNOSIS — C7951 Secondary malignant neoplasm of bone: Principal | ICD-10-CM

## 2014-11-01 DIAGNOSIS — C61 Malignant neoplasm of prostate: Secondary | ICD-10-CM

## 2014-11-01 LAB — CBC WITH DIFFERENTIAL/PLATELET
BASOS ABS: 0 10*3/uL (ref 0–0.1)
Basophils Relative: 0 %
EOS PCT: 1 %
Eosinophils Absolute: 0.1 10*3/uL (ref 0–0.7)
HCT: 32.1 % — ABNORMAL LOW (ref 40.0–52.0)
Hemoglobin: 10.3 g/dL — ABNORMAL LOW (ref 13.0–18.0)
LYMPHS PCT: 14 %
Lymphs Abs: 1.4 10*3/uL (ref 1.0–3.6)
MCH: 30.8 pg (ref 26.0–34.0)
MCHC: 32 g/dL (ref 32.0–36.0)
MCV: 96.3 fL (ref 80.0–100.0)
Monocytes Absolute: 0.6 10*3/uL (ref 0.2–1.0)
Monocytes Relative: 6 %
NEUTROS ABS: 7.9 10*3/uL — AB (ref 1.4–6.5)
Neutrophils Relative %: 79 %
Platelets: 222 10*3/uL (ref 150–440)
RBC: 3.33 MIL/uL — ABNORMAL LOW (ref 4.40–5.90)
RDW: 18.6 % — AB (ref 11.5–14.5)
WBC: 10.1 10*3/uL (ref 3.8–10.6)

## 2014-11-07 ENCOUNTER — Other Ambulatory Visit: Payer: Self-pay | Admitting: *Deleted

## 2014-11-07 DIAGNOSIS — C7951 Secondary malignant neoplasm of bone: Principal | ICD-10-CM

## 2014-11-07 DIAGNOSIS — C61 Malignant neoplasm of prostate: Secondary | ICD-10-CM

## 2014-11-08 ENCOUNTER — Inpatient Hospital Stay: Payer: BLUE CROSS/BLUE SHIELD

## 2014-11-08 ENCOUNTER — Inpatient Hospital Stay (HOSPITAL_BASED_OUTPATIENT_CLINIC_OR_DEPARTMENT_OTHER): Payer: BLUE CROSS/BLUE SHIELD | Admitting: Internal Medicine

## 2014-11-08 VITALS — BP 113/74 | HR 70 | Temp 98.1°F | Resp 18 | Ht 70.0 in | Wt 206.4 lb

## 2014-11-08 DIAGNOSIS — Z7952 Long term (current) use of systemic steroids: Secondary | ICD-10-CM

## 2014-11-08 DIAGNOSIS — C7951 Secondary malignant neoplasm of bone: Principal | ICD-10-CM

## 2014-11-08 DIAGNOSIS — Z79899 Other long term (current) drug therapy: Secondary | ICD-10-CM

## 2014-11-08 DIAGNOSIS — Z923 Personal history of irradiation: Secondary | ICD-10-CM | POA: Diagnosis not present

## 2014-11-08 DIAGNOSIS — R3911 Hesitancy of micturition: Secondary | ICD-10-CM

## 2014-11-08 DIAGNOSIS — C61 Malignant neoplasm of prostate: Secondary | ICD-10-CM

## 2014-11-08 DIAGNOSIS — Z7982 Long term (current) use of aspirin: Secondary | ICD-10-CM

## 2014-11-08 DIAGNOSIS — R232 Flushing: Secondary | ICD-10-CM

## 2014-11-08 DIAGNOSIS — C772 Secondary and unspecified malignant neoplasm of intra-abdominal lymph nodes: Secondary | ICD-10-CM | POA: Diagnosis not present

## 2014-11-08 LAB — CBC WITH DIFFERENTIAL/PLATELET
BASOS ABS: 0 10*3/uL (ref 0–0.1)
BASOS PCT: 1 %
Eosinophils Absolute: 0.1 10*3/uL (ref 0–0.7)
Eosinophils Relative: 1 %
HCT: 33.2 % — ABNORMAL LOW (ref 40.0–52.0)
Hemoglobin: 10.6 g/dL — ABNORMAL LOW (ref 13.0–18.0)
Lymphocytes Relative: 16 %
Lymphs Abs: 1 10*3/uL (ref 1.0–3.6)
MCH: 30.7 pg (ref 26.0–34.0)
MCHC: 31.8 g/dL — AB (ref 32.0–36.0)
MCV: 96.4 fL (ref 80.0–100.0)
MONO ABS: 0.6 10*3/uL (ref 0.2–1.0)
Monocytes Relative: 11 %
NEUTROS ABS: 4.3 10*3/uL (ref 1.4–6.5)
NEUTROS PCT: 71 %
Platelets: 241 10*3/uL (ref 150–440)
RBC: 3.44 MIL/uL — ABNORMAL LOW (ref 4.40–5.90)
RDW: 18.3 % — AB (ref 11.5–14.5)
WBC: 6 10*3/uL (ref 3.8–10.6)

## 2014-11-08 LAB — COMPREHENSIVE METABOLIC PANEL
ALT: 20 U/L (ref 17–63)
AST: 16 U/L (ref 15–41)
Albumin: 3.8 g/dL (ref 3.5–5.0)
Alkaline Phosphatase: 681 U/L — ABNORMAL HIGH (ref 38–126)
Anion gap: 3 — ABNORMAL LOW (ref 5–15)
BILIRUBIN TOTAL: 0.4 mg/dL (ref 0.3–1.2)
BUN: 12 mg/dL (ref 6–20)
CHLORIDE: 105 mmol/L (ref 101–111)
CO2: 28 mmol/L (ref 22–32)
Calcium: 8.7 mg/dL — ABNORMAL LOW (ref 8.9–10.3)
Creatinine, Ser: 0.68 mg/dL (ref 0.61–1.24)
GFR calc Af Amer: >60 mL/min (ref >60)
Glucose, Bld: 103 mg/dL — ABNORMAL HIGH (ref 65–99)
Potassium: 4.5 mmol/L (ref 3.5–5.1)
Sodium: 136 mmol/L (ref 135–145)
Total Protein: 6.4 g/dL — ABNORMAL LOW (ref 6.5–8.1)

## 2014-11-08 MED ORDER — RANITIDINE HCL 150 MG PO TABS: 150.0000 mg | ORAL_TABLET | Freq: Two times a day (BID) | ORAL | Status: DC

## 2014-11-08 MED ORDER — FENTANYL 100 MCG/HR TD PT72: 100.0000 ug | MEDICATED_PATCH | TRANSDERMAL | Status: DC

## 2014-11-08 NOTE — Progress Notes (Signed)
[place] note   Referred by    This 59 y.o. male patient presents to the clinic for f/u prostate cancer   Chief Complaint/Problem          1. 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  EPIDURAL DISEASE BELOW THE CONUS, AND DIFFUSE RETROPERITONEAL ADENOPATHY, LOW PSA AT 5.7.Marland KitchenMarland Kitchens/p palliative xrt//.[place] 1 12/21, then 1/18,  XGEVA 1/11,   TAXOTERE PLUS NEULASTA  CYCLE 1 12/21,   HPI:  Patient is here for further evaluation and treatment consideration regarding prostate cancer. He is s/p cycle 6 day with Taxotere. Dose of Taxotere was previously reduced to 76m/m2. He denies any fever, chills, nausea, vomiting, diarrhea, cough, shortness of breath, or chest pain. He is stronger today  He has peripheral neuropathy with bilateral leg weakness, stronger . His walking reports more steady . he reports hand strength steady since last visit He hadprior injection site red, inflammed, area prior that resolved. no acute complaints. Still some sweating intermittently. He reports a better mood as he is more mobile. He had some hesitancy but no bladder spasms or suprapubic pain, this is completely resolved if he drinks enough fluid he does not have dysuria     Review of Systems:  General: No acute distress,  No recent weight loss, fever chills or sweats , he is regaining some weight   HEENT: No headache, dizziness, ear or jaw pain, or epistaxis   Lungs: No cough, shortness of breath, at rest,  No wheezing, No chest pain, No, hemoptysis  Cardiac: no chest pain, no palpitations, no orthostasis,     GI: no abdominal pain, nausea, vomiting, diahrrea, or reflux   GU: no dysuria, no hematuria, no vaginal bleeding  Musculoskeletal: Some, but controlled by narcotics bone pain, no acutely painful joints,     Skin: no bruising, no rash  Neuro: no headache, no dizzy,   Psych: no anxiety, no depression   Allergies Allergies  Allergen  Reactions  . No Known Allergies     Significant History/PMH: Past Medical History  Diagnosis Date  . Prostate cancer   . Anemia    Past Surgical History  Procedure Laterality Date  . Hernia repair              Smoking History: Prior smoker quit within the last year  [place] Family History: No family history on file.  Comments:   Social History:  History  Alcohol Use No    Additional Past Medical and Surgical History:    Home Medications: Prior to Admission medications   Medication Sig Start Date End Date Taking? Authorizing Provider  aspirin 81 MG tablet Take 81 mg by mouth daily. In the morning   Yes Historical Provider, MD  calcium gluconate 500 MG tablet Take 1 tablet by mouth 2 (two) times daily.   Yes Historical Provider, MD  dexamethasone (DECADRON) 4 MG tablet Take 4 mg by mouth 2 (two) times daily. [facility] [place] [facility]   Yes Historical Provider, MD  docusate sodium (COLACE) 100 MG capsule Take 100 mg by mouth 3 (three) times daily. As needed for constipation   Yes Historical Provider, MD  ENSURE (ENSURE) Take 1 Can by mouth 3 (three) times daily between meals.   Yes Historical Provider, MD  fentaNYL (DURAGESIC - DOSED MCG/HR) 100 MCG/HR Place 100 mcg onto the skin every 3 (three) days.   Yes Historical Provider, MD  fentaNYL (DURAGESIC - DOSED MCG/HR) 50 MCG/HR Place 1 patch (50 mcg total) onto the skin every 3 (three) days. 10/31/14  Yes Dallas Schimke, MD  gabapentin (NEURONTIN) 300 MG capsule Take 1 capsule (300 mg total) by mouth 3 (three) times daily. At bedtme 10/31/14  Yes Dallas Schimke, MD  HYDROmorphone (DILAUDID) 2 MG tablet Take 2 mg by mouth daily.   Yes Historical Provider, MD  lidocaine-prilocaine (EMLA) cream  10/18/14  Yes Historical Provider, MD  loratadine (CLARITIN) 10 MG tablet Take 10 mg by mouth daily. 1 tablet orally once a day (only on the day before chemotherapy, the day of chemotherapy and the day after  chemotherapy.)   Yes Historical Provider, MD  metoprolol (LOPRESSOR) 50 MG tablet Take 1 tablet (50 mg total) by mouth 2 (two) times daily. 10/31/14  Yes Dallas Schimke, MD  polyethylene glycol (MIRALAX / GLYCOLAX) packet Take 17 g by mouth 2 (two) times daily.   Yes Historical Provider, MD  predniSONE (DELTASONE) 5 MG tablet Take 1 tablet (5 mg total) by mouth 2 (two) times daily. 10/31/14  Yes Dallas Schimke, MD  ranitidine (ZANTAC) 150 MG tablet Take 150 mg by mouth 2 (two) times daily.   Yes Historical Provider, MD  venlafaxine (EFFEXOR) 37.5 MG tablet Take 37.5 mg by mouth at bedtime.   Yes Historical Provider, MD  Vitamin D, Ergocalciferol, (DRISDOL) 50000 UNITS CAPS capsule Take 50,000 Units by mouth every 7 (seven) days.   Yes Historical Provider, MD  diphenhydrAMINE (BENADRYL) 25 MG tablet Take 25 mg by mouth every 8 (eight) hours as needed for itching.    Historical Provider, MD    Vital Signs:  Blood pressure 113/74, pulse 70, temperature 98.1 F (36.7 C), temperature source Oral, resp. rate 18, height 5' 10"  (1.778 m), weight 206 lb 5.6 oz (93.6 kg).  Physical Exam:  General: well developed, well nourished, and no acute distress  Mental Status: alert and oriented to person, place and time  Head, Ears, Nose,Throat: No thrush  Respiratory: no rales, rhonchi, or wheezing, no dullness  Cardiovascular: regular rate and rhythm  Gastrointestinal: soft, non tender, no masses or organomegaly  Musculoskeletal: no lower extremity edema no calf tenderness  Skin: no rashes, no bruises  Neurological: No gross focal weakness cranial nerves intact, some lower extremity weakness as before 4/5   Lymphatics: Not palpable, neck supraclavicular, submandibular, axilla    Psych: Mood, Affect, Unremarkable    Laboratory Results: Appointment on 11/08/2014  Component Date Value Ref Range Status  . WBC 11/08/2014 6.0  3.8 - 10.6 K/uL Final  . RBC 11/08/2014 3.44* 4.40 - 5.90 MIL/uL Final  .  Hemoglobin 11/08/2014 10.6* 13.0 - 18.0 g/dL Final  . HCT 11/08/2014 33.2* 40.0 - 52.0 % Final  . MCV 11/08/2014 96.4  80.0 - 100.0 fL Final  . MCH 11/08/2014 30.7  26.0 - 34.0 pg Final  . MCHC 11/08/2014 31.8* 32.0 - 36.0 g/dL Final  . RDW 11/08/2014 18.3* 11.5 - 14.5 % Final  . Platelets 11/08/2014 241  150 - 440 K/uL Final  . Neutrophils Relative % 11/08/2014 71   Final  . Neutro Abs 11/08/2014 4.3  1.4 - 6.5 K/uL Final  . Lymphocytes Relative 11/08/2014 16   Final  . Lymphs Abs 11/08/2014 1.0  1.0 - 3.6 K/uL Final  . Monocytes Relative 11/08/2014 11   Final  . Monocytes Absolute 11/08/2014 0.6  0.2 - 1.0 K/uL Final  . Eosinophils Relative 11/08/2014 1  Final  . Eosinophils Absolute 11/08/2014 0.1  0 - 0.7 K/uL Final  . Basophils Relative 11/08/2014 1   Final  . Basophils Absolute 11/08/2014 0.0  0 - 0.1 K/uL Final  . Sodium 11/08/2014 136  135 - 145 mmol/L Final  . Potassium 11/08/2014 4.5  3.5 - 5.1 mmol/L Final  . Chloride 11/08/2014 105  101 - 111 mmol/L Final  . CO2 11/08/2014 28  22 - 32 mmol/L Final  . Glucose, Bld 11/08/2014 103* 65 - 99 mg/dL Final  . BUN 11/08/2014 12  6 - 20 mg/dL Final  . Creatinine, Ser 11/08/2014 0.68  0.61 - 1.24 mg/dL Final  . Calcium 11/08/2014 8.7* 8.9 - 10.3 mg/dL Final  . Total Protein 11/08/2014 6.4* 6.5 - 8.1 g/dL Final  . Albumin 11/08/2014 3.8  3.5 - 5.0 g/dL Final  . AST 11/08/2014 16  15 - 41 U/L Final  . ALT 11/08/2014 20  17 - 63 U/L Final  . Alkaline Phosphatase 11/08/2014 681* 38 - 126 U/L Final  . Total Bilirubin 11/08/2014 0.4  0.3 - 1.2 mg/dL Final  . GFR calc non Af Amer 11/08/2014 >60  >60 mL/min Final  . GFR calc Af Amer 11/08/2014 >60  >60 mL/min Final   Comment: (NOTE) The eGFR has been calculated using the CKD EPI equation. This calculation has not been validated in all clinical situations. eGFR's persistently <60 mL/min signify possible Chronic Kidney Disease.   . Anion gap 11/08/2014 3* 5 - 15 Final           Radiology Results: No results found.         Assessment and Plan: Impression: 1. Stage 4 prostate cancer, bone and lymph node involvement, low psa, poor prognosis disease, bulk disease diffusely to bone. Has completed a course of  palliative xrt.  stable clinically, stable neuropathy since dose reduction, lft ok  cbc good, improved anemia well tolerated, pain control stable no constipation, prior degaralix injection site rx subsided, ca slightly low, no new bone bpain, stable low back pain   Plan: Go ahead and get noncontrast CT of the abdomen and pelvis to compare to prior to follow treatment response. For stable or improved continue same. If he has a failure he may go on a require cabazitaxel. Next week he is due for his next dose to degaralix and we'll try and continue xgeva

## 2014-11-14 ENCOUNTER — Ambulatory Visit
Admission: RE | Admit: 2014-11-14 | Discharge: 2014-11-14 | Disposition: A | Discharge status: Home / Self Care | Payer: BLUE CROSS/BLUE SHIELD | Source: Ambulatory Visit | Attending: Internal Medicine | Admitting: Internal Medicine

## 2014-11-14 DIAGNOSIS — C7951 Secondary malignant neoplasm of bone: Secondary | ICD-10-CM | POA: Insufficient documentation

## 2014-11-14 DIAGNOSIS — C772 Secondary and unspecified malignant neoplasm of intra-abdominal lymph nodes: Secondary | ICD-10-CM

## 2014-11-14 DIAGNOSIS — N289 Disorder of kidney and ureter, unspecified: Secondary | ICD-10-CM | POA: Insufficient documentation

## 2014-11-14 DIAGNOSIS — C61 Malignant neoplasm of prostate: Secondary | ICD-10-CM | POA: Diagnosis present

## 2014-11-15 ENCOUNTER — Inpatient Hospital Stay: Payer: BLUE CROSS/BLUE SHIELD

## 2014-11-15 ENCOUNTER — Inpatient Hospital Stay (HOSPITAL_BASED_OUTPATIENT_CLINIC_OR_DEPARTMENT_OTHER): Payer: BLUE CROSS/BLUE SHIELD | Admitting: Oncology

## 2014-11-15 VITALS — BP 106/72 | HR 94 | Temp 98.7°F | Resp 18 | Ht 70.0 in | Wt 200.9 lb

## 2014-11-15 VITALS — BP 107/69 | HR 80 | Resp 20

## 2014-11-15 DIAGNOSIS — Z7982 Long term (current) use of aspirin: Secondary | ICD-10-CM

## 2014-11-15 DIAGNOSIS — T451X5S Adverse effect of antineoplastic and immunosuppressive drugs, sequela: Secondary | ICD-10-CM

## 2014-11-15 DIAGNOSIS — C61 Malignant neoplasm of prostate: Secondary | ICD-10-CM | POA: Diagnosis not present

## 2014-11-15 DIAGNOSIS — R590 Localized enlarged lymph nodes: Secondary | ICD-10-CM

## 2014-11-15 DIAGNOSIS — R531 Weakness: Secondary | ICD-10-CM

## 2014-11-15 DIAGNOSIS — C7951 Secondary malignant neoplasm of bone: Secondary | ICD-10-CM

## 2014-11-15 DIAGNOSIS — Z79818 Long term (current) use of other agents affecting estrogen receptors and estrogen levels: Secondary | ICD-10-CM | POA: Diagnosis not present

## 2014-11-15 DIAGNOSIS — C772 Secondary and unspecified malignant neoplasm of intra-abdominal lymph nodes: Principal | ICD-10-CM

## 2014-11-15 DIAGNOSIS — G620 Drug-induced polyneuropathy: Secondary | ICD-10-CM

## 2014-11-15 DIAGNOSIS — Z87891 Personal history of nicotine dependence: Secondary | ICD-10-CM

## 2014-11-15 DIAGNOSIS — Z79899 Other long term (current) drug therapy: Secondary | ICD-10-CM

## 2014-11-15 LAB — CBC WITH DIFFERENTIAL/PLATELET
Basophils Absolute: 0 10*3/uL (ref 0–0.1)
Basophils Relative: 1 %
Eosinophils Absolute: 0 10*3/uL (ref 0–0.7)
Eosinophils Relative: 0 %
HEMATOCRIT: 34.6 % — AB (ref 40.0–52.0)
HEMOGLOBIN: 11.2 g/dL — AB (ref 13.0–18.0)
Lymphocytes Relative: 13 %
Lymphs Abs: 0.7 10*3/uL — ABNORMAL LOW (ref 1.0–3.6)
MCH: 30.7 pg (ref 26.0–34.0)
MCHC: 32.4 g/dL (ref 32.0–36.0)
MCV: 94.6 fL (ref 80.0–100.0)
MONOS PCT: 4 %
Monocytes Absolute: 0.2 10*3/uL (ref 0.2–1.0)
NEUTROS ABS: 4.3 10*3/uL (ref 1.4–6.5)
Neutrophils Relative %: 82 %
PLATELETS: 242 10*3/uL (ref 150–440)
RBC: 3.66 MIL/uL — ABNORMAL LOW (ref 4.40–5.90)
RDW: 17 % — ABNORMAL HIGH (ref 11.5–14.5)
WBC: 5.2 10*3/uL (ref 3.8–10.6)

## 2014-11-15 LAB — CALCIUM: Calcium: 8.6 mg/dL — ABNORMAL LOW (ref 8.9–10.3)

## 2014-11-15 LAB — CREATININE, SERUM
Creatinine, Ser: 0.68 mg/dL (ref 0.61–1.24)
GFR calc Af Amer: >60 mL/min (ref >60)

## 2014-11-15 MED ORDER — HEPARIN SOD (PORK) LOCK FLUSH 100 UNIT/ML IV SOLN
500.0000 [IU] | Freq: Once | INTRAVENOUS | Status: AC | PRN
  Administered 2014-11-15: 500 [IU]

## 2014-11-15 MED ORDER — SODIUM CHLORIDE 0.9 % IV SOLN
Freq: Once | INTRAVENOUS | Status: AC
  Administered 2014-11-15: 11:00:00 via INTRAVENOUS
  Filled 2014-11-15: qty 4

## 2014-11-15 MED ORDER — SODIUM CHLORIDE 0.9 % IV SOLN
Freq: Once | INTRAVENOUS | Status: AC
  Administered 2014-11-15: 10:00:00 via INTRAVENOUS
  Filled 2014-11-15: qty 250

## 2014-11-15 MED ORDER — DOCETAXEL CHEMO INJECTION 160 MG/16ML
37.5000 mg/m2 | Freq: Once | INTRAVENOUS | Status: AC
  Administered 2014-11-15: 80 mg via INTRAVENOUS
  Filled 2014-11-15: qty 8

## 2014-11-15 MED ORDER — DEGARELIX ACETATE 80 MG ~~LOC~~ SOLR
80.0000 mg | Freq: Once | SUBCUTANEOUS | Status: AC
  Administered 2014-11-15: 80 mg via SUBCUTANEOUS
  Filled 2014-11-15: qty 4

## 2014-11-15 MED ORDER — DENOSUMAB 120 MG/1.7ML ~~LOC~~ SOLN
120.0000 mg | Freq: Once | SUBCUTANEOUS | Status: AC
Start: 2014-11-15 — End: 2014-11-15
  Administered 2014-11-15: 120 mg via SUBCUTANEOUS
  Filled 2014-11-15: qty 1.7

## 2014-11-15 MED ORDER — HEPARIN SOD (PORK) LOCK FLUSH 100 UNIT/ML IV SOLN
500.0000 [IU] | Freq: Once | INTRAVENOUS | Status: AC
  Administered 2014-11-15: 500 [IU] via INTRAVENOUS

## 2014-11-15 MED ORDER — FENTANYL 50 MCG/HR TD PT72: 50.0000 ug | MEDICATED_PATCH | TRANSDERMAL | Status: DC

## 2014-11-15 MED ORDER — HEPARIN SOD (PORK) LOCK FLUSH 100 UNIT/ML IV SOLN
INTRAVENOUS | Status: AC
  Filled 2014-11-15: qty 5

## 2014-11-16 NOTE — Progress Notes (Signed)
Called pharmacy 11/16/14--Fentanyl patch approved to paid under [facility] fund./Khadir Roam S

## 2014-11-22 ENCOUNTER — Other Ambulatory Visit: Payer: Self-pay | Admitting: *Deleted

## 2014-11-22 MED ORDER — FENTANYL 100 MCG/HR TD PT72: 100.0000 ug | MEDICATED_PATCH | TRANSDERMAL | Status: DC

## 2014-11-22 MED ORDER — HYDROMORPHONE HCL 2 MG PO TABS: 2.0000 mg | ORAL_TABLET | Freq: Every day | ORAL | Status: DC

## 2014-11-29 ENCOUNTER — Inpatient Hospital Stay: Payer: BLUE CROSS/BLUE SHIELD | Attending: Internal Medicine

## 2014-11-29 DIAGNOSIS — G629 Polyneuropathy, unspecified: Secondary | ICD-10-CM | POA: Insufficient documentation

## 2014-11-29 DIAGNOSIS — Z87891 Personal history of nicotine dependence: Secondary | ICD-10-CM | POA: Insufficient documentation

## 2014-11-29 DIAGNOSIS — Z95828 Presence of other vascular implants and grafts: Secondary | ICD-10-CM

## 2014-11-29 DIAGNOSIS — Z79899 Other long term (current) drug therapy: Secondary | ICD-10-CM | POA: Insufficient documentation

## 2014-11-29 DIAGNOSIS — Z5111 Encounter for antineoplastic chemotherapy: Secondary | ICD-10-CM | POA: Insufficient documentation

## 2014-11-29 DIAGNOSIS — D649 Anemia, unspecified: Secondary | ICD-10-CM | POA: Insufficient documentation

## 2014-11-29 DIAGNOSIS — Z7982 Long term (current) use of aspirin: Secondary | ICD-10-CM | POA: Insufficient documentation

## 2014-11-29 DIAGNOSIS — C61 Malignant neoplasm of prostate: Secondary | ICD-10-CM | POA: Insufficient documentation

## 2014-11-29 DIAGNOSIS — C7951 Secondary malignant neoplasm of bone: Secondary | ICD-10-CM | POA: Insufficient documentation

## 2014-11-29 MED ORDER — HEPARIN SOD (PORK) LOCK FLUSH 100 UNIT/ML IV SOLN
500.0000 [IU] | Freq: Once | INTRAVENOUS | Status: DC
  Filled 2014-11-29: qty 5

## 2014-11-29 MED ORDER — SODIUM CHLORIDE 0.9 % IJ SOLN
10.0000 mL | INTRAMUSCULAR | Status: DC | PRN
  Filled 2014-11-29: qty 10

## 2014-12-02 NOTE — Progress Notes (Signed)
[place]  Telephone:(336) [phone] Fax:(336) [phone]  ID: Anthony [name] OB: 1955/11/30  MR#: [id number]  ZMO#:[id number]  Patient Care Team: Tracie Harrier, MD as PCP - General (Internal Medicine)  CHIEF COMPLAINT:  Chief Complaint  Patient presents with  . Follow-up    Prostate Cancer and bone    INTERVAL HISTORY: Patient returns to clinic today for consideration of cycle 7 of Taxotere for his metastatic prostate cancer. Patient initiated treatment on June 12, 2014. He continues to have bilateral peripheral neuropathy, but this is unchanged. He does not complain of increased weakness or fatigue today. He denies any fevers. He has a good appetite. He denies any chest pain or shortness of breath. He denies any nausea, vomiting, constipation, or diarrhea. He has no urinary complaints. Patient otherwise feels well and offers no further specific complaints.   REVIEW OF SYSTEMS:   Review of Systems  Constitutional: Negative for fever and weight loss.  Respiratory: Negative.   Cardiovascular: Negative.   Gastrointestinal: Negative.   Neurological: Positive for weakness.    As per HPI. Otherwise, a complete review of systems is negatve.  PAST MEDICAL HISTORY: Past Medical History  Diagnosis Date  . Prostate cancer   . Anemia     PAST SURGICAL HISTORY: Past Surgical History  Procedure Laterality Date  . Hernia repair      FAMILY HISTORY: Reviewed and unchanged. No reported history of malignancy or chronic disease.     ADVANCED DIRECTIVES:    HEALTH MAINTENANCE: History  Substance Use Topics  . Smoking status: Former Smoker    Types: Cigarettes    Quit date: 09/25/2013  . Smokeless tobacco: Not on file  . Alcohol Use: No     Colonoscopy:  PAP:  Bone density:  Lipid panel:  Allergies  Allergen Reactions  . No Known Allergies     Current Outpatient Prescriptions  Medication Sig Dispense Refill  . albuterol (PROVENTIL  HFA;VENTOLIN HFA) 108 (90 BASE) MCG/ACT inhaler Inhale 90 mcg into the lungs as needed. 2 puffs by mouth every 6 hours as needed for wheezing    . aspirin 81 MG tablet Take 81 mg by mouth daily. In the morning    . calcium gluconate 500 MG tablet Take 1 tablet by mouth 3 (three) times daily.     Marland Kitchen dexamethasone (DECADRON) 4 MG tablet Take 4 mg by mouth 2 (two) times daily. At [facility] take the night before chemotherapy treatments    . diphenhydrAMINE (BENADRYL) 25 MG tablet Take 25 mg by mouth every 8 (eight) hours as needed for itching.    . docusate sodium (COLACE) 100 MG capsule Take 100 mg by mouth 3 (three) times daily. As needed for constipation    . ENSURE (ENSURE) Take 1 Can by mouth 3 (three) times daily between meals.    . fentaNYL (DURAGESIC - DOSED MCG/HR) 50 MCG/HR Place 1 patch (50 mcg total) onto the skin every 3 (three) days. 10 patch 0  . gabapentin (NEURONTIN) 300 MG capsule Take 1 capsule (300 mg total) by mouth 3 (three) times daily. At bedtme 90 capsule 1  . lidocaine-prilocaine (EMLA) cream     . loratadine (CLARITIN) 10 MG tablet Take 10 mg by mouth daily. 1 tablet orally once a day (only on the day before chemotherapy, the day of chemotherapy and the day after chemotherapy.)    . metoprolol (LOPRESSOR) 50 MG tablet Take 1 tablet (50 mg total) by mouth 2 (two) times daily. 60 tablet 2  .  polyethylene glycol (MIRALAX / GLYCOLAX) packet Take 17 g by mouth 2 (two) times daily.    . predniSONE (DELTASONE) 5 MG tablet Take 1 tablet (5 mg total) by mouth 2 (two) times daily. 60 tablet 1  . ranitidine (ZANTAC) 150 MG tablet Take 1 tablet (150 mg total) by mouth 2 (two) times daily. 60 tablet 2  . venlafaxine (EFFEXOR) 37.5 MG tablet Take 37.5 mg by mouth at bedtime.    . Vitamin D, Ergocalciferol, (DRISDOL) 50000 UNITS CAPS capsule Take 50,000 Units by mouth every 7 (seven) days.    . fentaNYL (DURAGESIC - DOSED MCG/HR) 100 MCG/HR Place 1 patch (100 mcg total) onto the skin  every 3 (three) days. 5 patch 0  . HYDROmorphone (DILAUDID) 2 MG tablet Take 1 tablet (2 mg total) by mouth daily. 30 tablet 0   No current facility-administered medications for this visit.    OBJECTIVE: Filed Vitals:   11/15/14 0917  BP: 106/72  Pulse: 94  Temp: 98.7 F (37.1 C)  Resp: 18     Body mass index is 28.83 kg/(m^2).    ECOG FS:1 - Symptomatic but completely ambulatory  General: Well-developed, well-nourished, no acute distress. Eyes: anicteric sclera. Lungs: Clear to auscultation bilaterally. Heart: Regular rate and rhythm. No rubs, murmurs, or gallops. Abdomen: Soft, nontender, nondistended. No organomegaly noted, normoactive bowel sounds. Musculoskeletal: No edema, cyanosis, or clubbing. Neuro: Alert, answering all questions appropriately. Cranial nerves grossly intact. Skin: No rashes or petechiae noted. Psych: Normal affect.   LAB RESULTS:  Lab Results  Component Value Date   NA 136 11/08/2014   K 4.5 11/08/2014   CL 105 11/08/2014   CO2 28 11/08/2014   GLUCOSE 103* 11/08/2014   BUN 12 11/08/2014   CREATININE 0.68 11/15/2014   CALCIUM 8.6* 11/15/2014   PROT 6.4* 11/08/2014   ALBUMIN 3.8 11/08/2014   AST 16 11/08/2014   ALT 20 11/08/2014   ALKPHOS 681* 11/08/2014   BILITOT 0.4 11/08/2014   GFRNONAA >60 11/15/2014   GFRAA >60 11/15/2014    Lab Results  Component Value Date   WBC 5.2 11/15/2014   NEUTROABS 4.3 11/15/2014   HGB 11.2* 11/15/2014   HCT 34.6* 11/15/2014   MCV 94.6 11/15/2014   PLT 242 11/15/2014     STUDIES: Ct Abdomen Pelvis Wo Contrast  11/14/2014   CLINICAL DATA:  Stage IV prostate cancer with bone metastasis. Chemotherapy and radiation therapy.  EXAM: CT ABDOMEN AND PELVIS WITHOUT CONTRAST  TECHNIQUE: Multidetector CT imaging of the abdomen and pelvis was performed following the standard protocol without IV contrast.  COMPARISON:  08/15/2014  FINDINGS: Lower chest: Clear lung bases. Normal heart size without pericardial or  pleural effusion. A right-sided Port-A-Cath is incompletely imaged and terminates in the right atrium.  Hepatobiliary: Normal liver. Normal gallbladder, without biliary ductal dilatation.  Pancreas: Normal, without mass or ductal dilatation.  Spleen: Normal  Adrenals/Urinary Tract: Normal adrenal glands. No renal calculi or hydronephrosis. Left renal lesions which are fluid density and likely cysts. Upper pole 3.6 cm dominant lesion. No hydroureter or ureteric calculi. No bladder calculi.  Stomach/Bowel: Apparent greater curvature gastric wall thickening is favored to be due to underdistention. Colonic stool burden suggests constipation. Normal terminal ileum and appendix. Normal small bowel.  Vascular/Lymphatic: Normal caliber of the aorta and branch vessels. Further improvement of retroperitoneal adenopathy. A left periaortic node measures 1.6 x 2.3 cm on image 30 versus 2.0 x 2.5 cm on the prior.  A node just lateral to the  aortic bifurcation measures 1.4 x 2.4 cm on image 41 versus 1.3 x 2.4 cm on the prior  Left common iliac node measures 1.3 cm on image 45 versus 1.7 cm on the prior exam (when remeasured).  Reproductive: Mild prostatomegaly.  Other: No significant free fluid. Fat containing right inguinal hernia.  Musculoskeletal: Diffuse sclerotic osseous metastasis again identified. L2 compression deformity with ventral canal encroachment is similar. Mild compression deformity involves the superior endplate of Y50 and is unchanged. Convex left lumbar spine curvature.  IMPRESSION: 1. Response to therapy of nodal metastasis. 2. Similar osseous metastasis with L2 and less so T12 compression deformities. 3.  Possible constipation.   Electronically Signed   By: Abigail Miyamoto M.D.   On: 11/14/2014 12:02    ASSESSMENT: Stage IV prostate cancer with diffuse retroperitoneal lymphadenopathy as well as bony metastasis.  PLAN:    1. Prostate cancer: CT scan results as above and reviewed independently with improved  disease burden. Patient's PSA continues to be less than 0.1 despite evidence of metastatic disease.  Proceed with cycle 7 of dose reduced Taxotere. Patient will also receive 80mg  degarelix today, which he will receive on odd numbered cycles. Patient will also require Xgeva given his bony metastasis. Return to clinic in 3 weeks for further evaluation and consideration of cycle 8.  Patient recently completed a course of palliative XRT. If progression on Taxotere, can consider cabazitaxel in the future. 2. Pain: Continue current narcotic regimen. 3. Peripheral neuropathy: Dose reduced Taxotere as above, monitor.  Patient expressed understanding and was in agreement with this plan. He also understands that He can call clinic at any time with any questions, concerns, or complaints.   No matching staging information was found for the patient.  Lloyd Huger, MD   12/02/2014 7:19 PM

## 2014-12-05 ENCOUNTER — Other Ambulatory Visit: Payer: Self-pay | Admitting: *Deleted

## 2014-12-05 DIAGNOSIS — C61 Malignant neoplasm of prostate: Secondary | ICD-10-CM

## 2014-12-06 ENCOUNTER — Ambulatory Visit: Payer: BLUE CROSS/BLUE SHIELD | Admitting: Oncology

## 2014-12-06 ENCOUNTER — Inpatient Hospital Stay (HOSPITAL_BASED_OUTPATIENT_CLINIC_OR_DEPARTMENT_OTHER): Payer: BLUE CROSS/BLUE SHIELD | Admitting: Oncology

## 2014-12-06 ENCOUNTER — Inpatient Hospital Stay: Payer: BLUE CROSS/BLUE SHIELD

## 2014-12-06 ENCOUNTER — Ambulatory Visit: Payer: BLUE CROSS/BLUE SHIELD

## 2014-12-06 ENCOUNTER — Other Ambulatory Visit: Payer: BLUE CROSS/BLUE SHIELD

## 2014-12-06 VITALS — BP 103/75 | HR 70 | Temp 97.2°F | Resp 20 | Wt 208.8 lb

## 2014-12-06 DIAGNOSIS — D649 Anemia, unspecified: Secondary | ICD-10-CM | POA: Diagnosis not present

## 2014-12-06 DIAGNOSIS — G629 Polyneuropathy, unspecified: Secondary | ICD-10-CM

## 2014-12-06 DIAGNOSIS — Z87891 Personal history of nicotine dependence: Secondary | ICD-10-CM

## 2014-12-06 DIAGNOSIS — Z79899 Other long term (current) drug therapy: Secondary | ICD-10-CM | POA: Diagnosis not present

## 2014-12-06 DIAGNOSIS — C61 Malignant neoplasm of prostate: Secondary | ICD-10-CM

## 2014-12-06 DIAGNOSIS — Z5111 Encounter for antineoplastic chemotherapy: Secondary | ICD-10-CM | POA: Diagnosis not present

## 2014-12-06 DIAGNOSIS — Z7982 Long term (current) use of aspirin: Secondary | ICD-10-CM | POA: Diagnosis not present

## 2014-12-06 DIAGNOSIS — C772 Secondary and unspecified malignant neoplasm of intra-abdominal lymph nodes: Principal | ICD-10-CM

## 2014-12-06 DIAGNOSIS — C7951 Secondary malignant neoplasm of bone: Secondary | ICD-10-CM

## 2014-12-06 LAB — COMPREHENSIVE METABOLIC PANEL
ALT: 27 U/L (ref 17–63)
AST: 23 U/L (ref 15–41)
Albumin: 4 g/dL (ref 3.5–5.0)
Alkaline Phosphatase: 356 U/L — ABNORMAL HIGH (ref 38–126)
Anion gap: 5 (ref 5–15)
BUN: 9 mg/dL (ref 6–20)
CALCIUM: 7.7 mg/dL — AB (ref 8.9–10.3)
CO2: 23 mmol/L (ref 22–32)
CREATININE: 0.53 mg/dL — AB (ref 0.61–1.24)
Chloride: 109 mmol/L (ref 101–111)
GFR calc Af Amer: >60 mL/min (ref >60)
Glucose, Bld: 174 mg/dL — ABNORMAL HIGH (ref 65–99)
Potassium: 4 mmol/L (ref 3.5–5.1)
SODIUM: 137 mmol/L (ref 135–145)
Total Bilirubin: 0.4 mg/dL (ref 0.3–1.2)
Total Protein: 6.9 g/dL (ref 6.5–8.1)

## 2014-12-06 LAB — CBC WITH DIFFERENTIAL/PLATELET
BASOS PCT: 1 %
Basophils Absolute: 0 10*3/uL (ref 0–0.1)
EOS ABS: 0 10*3/uL (ref 0–0.7)
Eosinophils Relative: 0 %
HCT: 36.4 % — ABNORMAL LOW (ref 40.0–52.0)
Hemoglobin: 11.7 g/dL — ABNORMAL LOW (ref 13.0–18.0)
Lymphocytes Relative: 15 %
Lymphs Abs: 0.7 10*3/uL — ABNORMAL LOW (ref 1.0–3.6)
MCH: 29.8 pg (ref 26.0–34.0)
MCHC: 32.1 g/dL (ref 32.0–36.0)
MCV: 92.9 fL (ref 80.0–100.0)
MONO ABS: 0.3 10*3/uL (ref 0.2–1.0)
Monocytes Relative: 5 %
NEUTROS PCT: 79 %
Neutro Abs: 4 10*3/uL (ref 1.4–6.5)
Platelets: 287 10*3/uL (ref 150–440)
RBC: 3.92 MIL/uL — AB (ref 4.40–5.90)
RDW: 16.4 % — ABNORMAL HIGH (ref 11.5–14.5)
WBC: 5 10*3/uL (ref 3.8–10.6)

## 2014-12-06 MED ORDER — DOCETAXEL CHEMO INJECTION 160 MG/16ML
37.5000 mg/m2 | Freq: Once | INTRAVENOUS | Status: AC
  Administered 2014-12-06: 80 mg via INTRAVENOUS
  Filled 2014-12-06: qty 8

## 2014-12-06 MED ORDER — SODIUM CHLORIDE 0.9 % IV SOLN
Freq: Once | INTRAVENOUS | Status: AC
  Administered 2014-12-06: 10:00:00 via INTRAVENOUS
  Filled 2014-12-06: qty 1000

## 2014-12-06 MED ORDER — FENTANYL 50 MCG/HR TD PT72: 50.0000 ug | MEDICATED_PATCH | TRANSDERMAL | Status: DC

## 2014-12-06 MED ORDER — RANITIDINE HCL 150 MG PO TABS: 150.0000 mg | ORAL_TABLET | Freq: Two times a day (BID) | ORAL | Status: DC

## 2014-12-06 MED ORDER — FENTANYL 100 MCG/HR TD PT72: 100.0000 ug | MEDICATED_PATCH | TRANSDERMAL | Status: DC

## 2014-12-06 MED ORDER — SODIUM CHLORIDE 0.9 % IV SOLN
Freq: Once | INTRAVENOUS | Status: AC
  Administered 2014-12-06: 11:00:00 via INTRAVENOUS
  Filled 2014-12-06: qty 4

## 2014-12-06 MED ORDER — HEPARIN SOD (PORK) LOCK FLUSH 100 UNIT/ML IV SOLN
500.0000 [IU] | Freq: Once | INTRAVENOUS | Status: DC | PRN
  Filled 2014-12-06: qty 5

## 2014-12-25 NOTE — Progress Notes (Signed)
[place]  Telephone:(336) [phone] [phone]) [phone]  ID: Anthony [name] OB: 1955/07/14  MR#: [id number]  SWH#:[id number]  Patient Care Team: Tracie Harrier, MD as PCP - General (Internal Medicine)  CHIEF COMPLAINT:  Chief Complaint  Patient presents with  . Follow-up    metastatic prostate cancer    INTERVAL HISTORY: Patient returns to clinic today for consideration of cycle 8 of Taxotere for his metastatic prostate cancer. Patient initiated treatment on June 12, 2014. He continues to have bilateral peripheral neuropathy, but this is unchanged. He does not complain of increased weakness or fatigue today. He denies any fevers. He has a good appetite. He denies any chest pain or shortness of breath. He denies any nausea, vomiting, constipation, or diarrhea. He has no urinary complaints. Patient offers no further specific complaints today.   REVIEW OF SYSTEMS:   Review of Systems  Constitutional: Negative for fever and weight loss.  Respiratory: Negative.   Cardiovascular: Negative.   Gastrointestinal: Negative.   Neurological: Positive for weakness.    As per HPI. Otherwise, a complete review of systems is negatve.  PAST MEDICAL HISTORY: Past Medical History  Diagnosis Date  . Prostate cancer   . Anemia     PAST SURGICAL HISTORY: Past Surgical History  Procedure Laterality Date  . Hernia repair      FAMILY HISTORY: Reviewed and unchanged. No reported history of malignancy or chronic disease.     ADVANCED DIRECTIVES:    HEALTH MAINTENANCE: History  Substance Use Topics  . Smoking status: Former Smoker    Types: Cigarettes    Quit date: 09/25/2013  . Smokeless tobacco: Not on file  . Alcohol Use: No     Colonoscopy:  PAP:  Bone density:  Lipid panel:  Allergies  Allergen Reactions  . No Known Allergies     Current Outpatient Prescriptions  Medication Sig Dispense Refill  . albuterol (PROVENTIL HFA;VENTOLIN HFA) 108 (90  BASE) MCG/ACT inhaler Inhale 90 mcg into the lungs as needed. 2 puffs by mouth every 6 hours as needed for wheezing    . aspirin 81 MG tablet Take 81 mg by mouth daily. In the morning    . calcium gluconate 500 MG tablet Take 1 tablet by mouth 3 (three) times daily.     Marland Kitchen dexamethasone (DECADRON) 4 MG tablet Take 4 mg by mouth 2 (two) times daily. At [facility] take the night before chemotherapy treatments    . diphenhydrAMINE (BENADRYL) 25 MG tablet Take 25 mg by mouth every 8 (eight) hours as needed for itching.    . docusate sodium (COLACE) 100 MG capsule Take 100 mg by mouth 3 (three) times daily. As needed for constipation    . ENSURE (ENSURE) Take 1 Can by mouth 3 (three) times daily between meals.    . fentaNYL (DURAGESIC - DOSED MCG/HR) 100 MCG/HR Place 1 patch (100 mcg total) onto the skin every 3 (three) days. 10 patch 0  . fentaNYL (DURAGESIC - DOSED MCG/HR) 50 MCG/HR Place 1 patch (50 mcg total) onto the skin every 3 (three) days. 10 patch 0  . gabapentin (NEURONTIN) 300 MG capsule Take 1 capsule (300 mg total) by mouth 3 (three) times daily. At bedtme 90 capsule 1  . HYDROmorphone (DILAUDID) 2 MG tablet Take 1 tablet (2 mg total) by mouth daily. 30 tablet 0  . lidocaine-prilocaine (EMLA) cream     . loratadine (CLARITIN) 10 MG tablet Take 10 mg by mouth daily. 1 tablet orally once a day (  only on the day before chemotherapy, the day of chemotherapy and the day after chemotherapy.)    . metoprolol (LOPRESSOR) 50 MG tablet Take 1 tablet (50 mg total) by mouth 2 (two) times daily. 60 tablet 2  . polyethylene glycol (MIRALAX / GLYCOLAX) packet Take 17 g by mouth 2 (two) times daily.    . predniSONE (DELTASONE) 5 MG tablet Take 1 tablet (5 mg total) by mouth 2 (two) times daily. 60 tablet 1  . ranitidine (ZANTAC) 150 MG tablet Take 1 tablet (150 mg total) by mouth 2 (two) times daily. 60 tablet 2  . venlafaxine (EFFEXOR) 37.5 MG tablet Take 37.5 mg by mouth at bedtime.    . Vitamin D,  Ergocalciferol, (DRISDOL) 50000 UNITS CAPS capsule Take 50,000 Units by mouth every 7 (seven) days.     No current facility-administered medications for this visit.    OBJECTIVE: Filed Vitals:   12/06/14 1008  BP: 103/75  Pulse: 70  Temp: 97.2 F (36.2 C)  Resp: 20     Body mass index is 29.96 kg/(m^2).    ECOG FS:1 - Symptomatic but completely ambulatory  General: Well-developed, well-nourished, no acute distress. Eyes: anicteric sclera. Lungs: Clear to auscultation bilaterally. Heart: Regular rate and rhythm. No rubs, murmurs, or gallops. Abdomen: Soft, nontender, nondistended. No organomegaly noted, normoactive bowel sounds. Musculoskeletal: No edema, cyanosis, or clubbing. Neuro: Alert, answering all questions appropriately. Cranial nerves grossly intact. Skin: No rashes or petechiae noted. Psych: Normal affect.   LAB RESULTS:  Lab Results  Component Value Date   NA 137 12/06/2014   K 4.0 12/06/2014   CL 109 12/06/2014   CO2 23 12/06/2014   GLUCOSE 174* 12/06/2014   BUN 9 12/06/2014   CREATININE 0.53* 12/06/2014   CALCIUM 7.7* 12/06/2014   PROT 6.9 12/06/2014   ALBUMIN 4.0 12/06/2014   AST 23 12/06/2014   ALT 27 12/06/2014   ALKPHOS 356* 12/06/2014   BILITOT 0.4 12/06/2014   GFRNONAA >60 12/06/2014   GFRAA >60 12/06/2014    Lab Results  Component Value Date   WBC 5.0 12/06/2014   NEUTROABS 4.0 12/06/2014   HGB 11.7* 12/06/2014   HCT 36.4* 12/06/2014   MCV 92.9 12/06/2014   PLT 287 12/06/2014     STUDIES: No results found.  ASSESSMENT: Stage IV prostate cancer with diffuse retroperitoneal lymphadenopathy as well as bony metastasis.  PLAN:    1. Prostate cancer: CT scan results after cycle 6 reviewed independently with improved disease burden. Patient's PSA continues to be less than 0.1 despite evidence of metastatic disease.  Proceed with cycle 8 of dose reduced Taxotere. Patient will also receive 80mg  degarelix today, which he will receive on odd  numbered cycles. Patient will also require Xgeva given his bony metastasis. Return to clinic in 3 weeks for further evaluation and consideration of cycle 9.  Patient recently completed a course of palliative XRT. If progression on Taxotere, can consider cabazitaxel in the future. Plan to reimage at the conclusion of cycle 12. 2. Pain: Continue current narcotic regimen. 3. Peripheral neuropathy: Dose reduced Taxotere as above, monitor.  Patient expressed understanding and was in agreement with this plan. He also understands that He can call clinic at any time with any questions, concerns, or complaints.    Lloyd Huger, MD   12/25/2014 8:18 AM

## 2014-12-27 ENCOUNTER — Inpatient Hospital Stay: Payer: BLUE CROSS/BLUE SHIELD

## 2014-12-27 ENCOUNTER — Inpatient Hospital Stay: Payer: BLUE CROSS/BLUE SHIELD | Attending: Internal Medicine

## 2014-12-27 ENCOUNTER — Inpatient Hospital Stay (HOSPITAL_BASED_OUTPATIENT_CLINIC_OR_DEPARTMENT_OTHER): Payer: BLUE CROSS/BLUE SHIELD | Admitting: Oncology

## 2014-12-27 VITALS — BP 121/74 | HR 83 | Resp 20

## 2014-12-27 VITALS — BP 116/78 | HR 88 | Temp 97.0°F | Resp 20 | Wt 216.9 lb

## 2014-12-27 DIAGNOSIS — Z7952 Long term (current) use of systemic steroids: Secondary | ICD-10-CM | POA: Insufficient documentation

## 2014-12-27 DIAGNOSIS — C7951 Secondary malignant neoplasm of bone: Secondary | ICD-10-CM

## 2014-12-27 DIAGNOSIS — Z79899 Other long term (current) drug therapy: Secondary | ICD-10-CM | POA: Diagnosis not present

## 2014-12-27 DIAGNOSIS — Z5111 Encounter for antineoplastic chemotherapy: Secondary | ICD-10-CM | POA: Diagnosis not present

## 2014-12-27 DIAGNOSIS — T451X5S Adverse effect of antineoplastic and immunosuppressive drugs, sequela: Secondary | ICD-10-CM

## 2014-12-27 DIAGNOSIS — R531 Weakness: Secondary | ICD-10-CM

## 2014-12-27 DIAGNOSIS — C61 Malignant neoplasm of prostate: Secondary | ICD-10-CM

## 2014-12-27 DIAGNOSIS — G620 Drug-induced polyneuropathy: Secondary | ICD-10-CM | POA: Insufficient documentation

## 2014-12-27 DIAGNOSIS — R590 Localized enlarged lymph nodes: Secondary | ICD-10-CM | POA: Diagnosis not present

## 2014-12-27 LAB — COMPREHENSIVE METABOLIC PANEL
ALT: 19 U/L (ref 17–63)
ANION GAP: 7 (ref 5–15)
AST: 23 U/L (ref 15–41)
Albumin: 3.9 g/dL (ref 3.5–5.0)
Alkaline Phosphatase: 202 U/L — ABNORMAL HIGH (ref 38–126)
BILIRUBIN TOTAL: 0.5 mg/dL (ref 0.3–1.2)
BUN: 10 mg/dL (ref 6–20)
CALCIUM: 8.2 mg/dL — AB (ref 8.9–10.3)
CO2: 23 mmol/L (ref 22–32)
Chloride: 110 mmol/L (ref 101–111)
Creatinine, Ser: 0.81 mg/dL (ref 0.61–1.24)
GFR calc Af Amer: >60 mL/min (ref >60)
GFR calc non Af Amer: >60 mL/min (ref >60)
GLUCOSE: 201 mg/dL — AB (ref 65–99)
Potassium: 3.7 mmol/L (ref 3.5–5.1)
Sodium: 140 mmol/L (ref 135–145)
Total Protein: 6.8 g/dL (ref 6.5–8.1)

## 2014-12-27 LAB — CBC WITH DIFFERENTIAL/PLATELET
Basophils Absolute: 0 10*3/uL (ref 0–0.1)
Basophils Relative: 0 %
EOS ABS: 0 10*3/uL (ref 0–0.7)
EOS PCT: 0 %
HEMATOCRIT: 36.5 % — AB (ref 40.0–52.0)
Hemoglobin: 11.7 g/dL — ABNORMAL LOW (ref 13.0–18.0)
LYMPHS ABS: 0.6 10*3/uL — AB (ref 1.0–3.6)
Lymphocytes Relative: 15 %
MCH: 29.6 pg (ref 26.0–34.0)
MCHC: 32.1 g/dL (ref 32.0–36.0)
MCV: 92.3 fL (ref 80.0–100.0)
Monocytes Absolute: 0.1 10*3/uL — ABNORMAL LOW (ref 0.2–1.0)
Monocytes Relative: 3 %
Neutro Abs: 3.5 10*3/uL (ref 1.4–6.5)
Neutrophils Relative %: 82 %
Platelets: 296 10*3/uL (ref 150–440)
RBC: 3.95 MIL/uL — ABNORMAL LOW (ref 4.40–5.90)
RDW: 16 % — AB (ref 11.5–14.5)
WBC: 4.2 10*3/uL (ref 3.8–10.6)

## 2014-12-27 MED ORDER — DENOSUMAB 120 MG/1.7ML ~~LOC~~ SOLN
120.0000 mg | Freq: Once | SUBCUTANEOUS | Status: AC
  Administered 2014-12-27: 120 mg via SUBCUTANEOUS
  Filled 2014-12-27: qty 1.7

## 2014-12-27 MED ORDER — SODIUM CHLORIDE 0.9 % IJ SOLN
10.0000 mL | INTRAMUSCULAR | Status: DC | PRN
  Administered 2014-12-27: 10 mL
  Filled 2014-12-27: qty 10

## 2014-12-27 MED ORDER — DOCETAXEL CHEMO INJECTION 160 MG/16ML
37.5000 mg/m2 | Freq: Once | INTRAVENOUS | Status: AC
  Administered 2014-12-27: 80 mg via INTRAVENOUS
  Filled 2014-12-27: qty 8

## 2014-12-27 MED ORDER — SODIUM CHLORIDE 0.9 % IV SOLN
Freq: Once | INTRAVENOUS | Status: AC
  Administered 2014-12-27: 11:00:00 via INTRAVENOUS
  Filled 2014-12-27: qty 4

## 2014-12-27 MED ORDER — FENTANYL 100 MCG/HR TD PT72: 100.0000 ug | MEDICATED_PATCH | TRANSDERMAL | Status: DC

## 2014-12-27 MED ORDER — SODIUM CHLORIDE 0.9 % IV SOLN
Freq: Once | INTRAVENOUS | Status: AC
  Administered 2014-12-27: 11:00:00 via INTRAVENOUS
  Filled 2014-12-27: qty 1000

## 2014-12-27 MED ORDER — VENLAFAXINE HCL 37.5 MG PO TABS: 37.5000 mg | ORAL_TABLET | Freq: Every day | ORAL | Status: DC

## 2014-12-27 MED ORDER — HYDROMORPHONE HCL 2 MG PO TABS: 2.0000 mg | ORAL_TABLET | Freq: Every day | ORAL | Status: DC

## 2014-12-27 MED ORDER — HEPARIN SOD (PORK) LOCK FLUSH 100 UNIT/ML IV SOLN
500.0000 [IU] | Freq: Once | INTRAVENOUS | Status: AC | PRN
  Administered 2014-12-27: 500 [IU]
  Filled 2014-12-27: qty 5

## 2014-12-27 MED ORDER — DEGARELIX ACETATE 80 MG ~~LOC~~ SOLR
80.0000 mg | Freq: Once | SUBCUTANEOUS | Status: AC
  Administered 2014-12-27: 80 mg via SUBCUTANEOUS
  Filled 2014-12-27: qty 4

## 2015-01-03 ENCOUNTER — Telehealth: Payer: Self-pay | Admitting: *Deleted

## 2015-01-03 MED ORDER — DEXAMETHASONE 4 MG PO TABS: ORAL_TABLET | ORAL | Status: DC

## 2015-01-03 MED ORDER — CALCIUM GLUCONATE 500 MG PO TABS: 1.0000 | ORAL_TABLET | Freq: Three times a day (TID) | ORAL | Status: DC

## 2015-01-03 MED ORDER — METOPROLOL TARTRATE 50 MG PO TABS: 50.0000 mg | ORAL_TABLET | Freq: Two times a day (BID) | ORAL | Status: DC

## 2015-01-03 MED ORDER — PREDNISONE 5 MG PO TABS: 5.0000 mg | ORAL_TABLET | Freq: Two times a day (BID) | ORAL | Status: DC

## 2015-01-03 NOTE — Progress Notes (Signed)
[place]  Telephone:(336) [phone] Fax:(336) [phone]  ID: Anthony [name] OB: March 06, 1956  MR#: [id number]  JSE#:[id number]  Patient Care Team: Tracie Harrier, MD as PCP - General (Internal Medicine)  CHIEF COMPLAINT:  Chief Complaint  Patient presents with  . Follow-up    metastatic prostate cancer    INTERVAL HISTORY: Patient returns to clinic today for consideration of cycle 9 of Taxotere for his metastatic prostate cancer. Patient initiated treatment on June 12, 2014. He continues to have bilateral peripheral neuropathy, but this is unchanged. He does not complain of increased weakness or fatigue today. He denies any fevers. He has a good appetite. He denies any chest pain or shortness of breath. He denies any nausea, vomiting, constipation, or diarrhea. He has no urinary complaints. Patient offers no further specific complaints today.   REVIEW OF SYSTEMS:   Review of Systems  Constitutional: Negative for fever and weight loss.  Respiratory: Negative.   Cardiovascular: Negative.   Gastrointestinal: Negative.   Neurological: Positive for weakness.    As per HPI. Otherwise, a complete review of systems is negatve.  PAST MEDICAL HISTORY: Past Medical History  Diagnosis Date  . Prostate cancer   . Anemia     PAST SURGICAL HISTORY: Past Surgical History  Procedure Laterality Date  . Hernia repair      FAMILY HISTORY: Reviewed and unchanged. No reported history of malignancy or chronic disease.     ADVANCED DIRECTIVES:    HEALTH MAINTENANCE: History  Substance Use Topics  . Smoking status: Former Smoker    Types: Cigarettes    Quit date: 09/25/2013  . Smokeless tobacco: Not on file  . Alcohol Use: No     Colonoscopy:  PAP:  Bone density:  Lipid panel:  Allergies  Allergen Reactions  . No Known Allergies     Current Outpatient Prescriptions  Medication Sig Dispense Refill  . albuterol (PROVENTIL HFA;VENTOLIN HFA) 108 (90  BASE) MCG/ACT inhaler Inhale 90 mcg into the lungs as needed. 2 puffs by mouth every 6 hours as needed for wheezing    . aspirin 81 MG tablet Take 81 mg by mouth daily. In the morning    . diphenhydrAMINE (BENADRYL) 25 MG tablet Take 25 mg by mouth every 8 (eight) hours as needed for itching.    . docusate sodium (COLACE) 100 MG capsule Take 100 mg by mouth 3 (three) times daily. As needed for constipation    . ENSURE (ENSURE) Take 1 Can by mouth 3 (three) times daily between meals.    . fentaNYL (DURAGESIC - DOSED MCG/HR) 100 MCG/HR Place 1 patch (100 mcg total) onto the skin every 3 (three) days. 10 patch 0  . fentaNYL (DURAGESIC - DOSED MCG/HR) 50 MCG/HR Place 1 patch (50 mcg total) onto the skin every 3 (three) days. 10 patch 0  . gabapentin (NEURONTIN) 300 MG capsule Take 1 capsule (300 mg total) by mouth 3 (three) times daily. At bedtme 90 capsule 1  . HYDROmorphone (DILAUDID) 2 MG tablet Take 1 tablet (2 mg total) by mouth daily. 30 tablet 0  . lidocaine-prilocaine (EMLA) cream     . loratadine (CLARITIN) 10 MG tablet Take 10 mg by mouth daily. 1 tablet orally once a day (only on the day before chemotherapy, the day of chemotherapy and the day after chemotherapy.)    . polyethylene glycol (MIRALAX / GLYCOLAX) packet Take 17 g by mouth 2 (two) times daily.    . ranitidine (ZANTAC) 150 MG tablet Take 1  tablet (150 mg total) by mouth 2 (two) times daily. 60 tablet 2  . venlafaxine (EFFEXOR) 37.5 MG tablet Take 1 tablet (37.5 mg total) by mouth at bedtime. 30 tablet 2  . Vitamin D, Ergocalciferol, (DRISDOL) 50000 UNITS CAPS capsule Take 50,000 Units by mouth every 7 (seven) days.    . calcium gluconate 500 MG tablet Take 1 tablet (500 mg total) by mouth 3 (three) times daily. 90 tablet 2  . dexamethasone (DECADRON) 4 MG tablet At [facility] take the night before chemotherapy treatments 30 tablet 0  . metoprolol (LOPRESSOR) 50 MG tablet Take 1 tablet (50 mg total) by mouth 2 (two) times daily.  60 tablet 2  . predniSONE (DELTASONE) 5 MG tablet Take 1 tablet (5 mg total) by mouth 2 (two) times daily. 60 tablet 0   No current facility-administered medications for this visit.    OBJECTIVE: Filed Vitals:   12/27/14 1015  BP: 116/78  Pulse: 88  Temp: 97 F (36.1 C)  Resp: 20     Body mass index is 31.13 kg/(m^2).    ECOG FS:1 - Symptomatic but completely ambulatory  General: Well-developed, well-nourished, no acute distress. Eyes: anicteric sclera. Lungs: Clear to auscultation bilaterally. Heart: Regular rate and rhythm. No rubs, murmurs, or gallops. Abdomen: Soft, nontender, nondistended. No organomegaly noted, normoactive bowel sounds. Musculoskeletal: No edema, cyanosis, or clubbing. Neuro: Alert, answering all questions appropriately. Cranial nerves grossly intact. Skin: No rashes or petechiae noted. Psych: Normal affect.   LAB RESULTS:  Lab Results  Component Value Date   NA 140 12/27/2014   K 3.7 12/27/2014   CL 110 12/27/2014   CO2 23 12/27/2014   GLUCOSE 201* 12/27/2014   BUN 10 12/27/2014   CREATININE 0.81 12/27/2014   CALCIUM 8.2* 12/27/2014   PROT 6.8 12/27/2014   ALBUMIN 3.9 12/27/2014   AST 23 12/27/2014   ALT 19 12/27/2014   ALKPHOS 202* 12/27/2014   BILITOT 0.5 12/27/2014   GFRNONAA >60 12/27/2014   GFRAA >60 12/27/2014    Lab Results  Component Value Date   WBC 4.2 12/27/2014   NEUTROABS 3.5 12/27/2014   HGB 11.7* 12/27/2014   HCT 36.5* 12/27/2014   MCV 92.3 12/27/2014   PLT 296 12/27/2014     STUDIES: No results found.  ASSESSMENT: Stage IV prostate cancer with diffuse retroperitoneal lymphadenopathy as well as bony metastasis.  PLAN:    1. Prostate cancer: CT scan results after cycle 6 reviewed independently with improved disease burden. Patient's PSA continues to be less than 0.1 despite evidence of metastatic disease.  Proceed with cycle 9 of dose reduced Taxotere. Patient will also receive 80mg  degarelix and 4mg  Zometa  today, which he will receive on odd numbered cycles. Return to clinic in 3 weeks for further evaluation and consideration of cycle 10.  Patient recently completed a course of palliative XRT. If progression on Taxotere, can consider cabazitaxel in the future. Plan to reimage at the conclusion of cycle 12. 2. Pain: Continue current narcotic regimen. 3. Peripheral neuropathy: Dose reduced Taxotere as above, monitor.  Patient expressed understanding and was in agreement with this plan. He also understands that He can call clinic at any time with any questions, concerns, or complaints.     Lloyd Huger, MD   01/03/2015 3:10 PM

## 2015-01-03 NOTE — Telephone Encounter (Signed)
E scribed adn SPoke with Barnabas Lister who said he called yesterday to authorize [employer] to pay for them

## 2015-01-10 ENCOUNTER — Inpatient Hospital Stay: Payer: BLUE CROSS/BLUE SHIELD

## 2015-01-10 VITALS — BP 113/72 | HR 87 | Temp 96.6°F | Resp 18

## 2015-01-10 DIAGNOSIS — C61 Malignant neoplasm of prostate: Secondary | ICD-10-CM | POA: Diagnosis not present

## 2015-01-10 DIAGNOSIS — C801 Malignant (primary) neoplasm, unspecified: Secondary | ICD-10-CM

## 2015-01-10 MED ORDER — SODIUM CHLORIDE 0.9 % IJ SOLN
10.0000 mL | INTRAMUSCULAR | Status: AC | PRN
Start: 2015-01-10 — End: ?
  Administered 2015-01-10: 10 mL via INTRAVENOUS
  Filled 2015-01-10: qty 10

## 2015-01-10 MED ORDER — HEPARIN SOD (PORK) LOCK FLUSH 100 UNIT/ML IV SOLN
INTRAVENOUS | Status: AC
  Filled 2015-01-10: qty 5

## 2015-01-10 MED ORDER — HEPARIN SOD (PORK) LOCK FLUSH 100 UNIT/ML IV SOLN
500.0000 [IU] | Freq: Once | INTRAVENOUS | Status: AC
  Administered 2015-01-10: 500 [IU] via INTRAVENOUS

## 2015-01-17 ENCOUNTER — Other Ambulatory Visit: Payer: Self-pay | Admitting: *Deleted

## 2015-01-17 ENCOUNTER — Telehealth: Payer: Self-pay | Admitting: *Deleted

## 2015-01-17 ENCOUNTER — Inpatient Hospital Stay: Payer: BLUE CROSS/BLUE SHIELD

## 2015-01-17 ENCOUNTER — Inpatient Hospital Stay (HOSPITAL_BASED_OUTPATIENT_CLINIC_OR_DEPARTMENT_OTHER): Payer: BLUE CROSS/BLUE SHIELD | Admitting: Oncology

## 2015-01-17 VITALS — BP 106/67 | HR 80 | Temp 96.4°F | Resp 18

## 2015-01-17 DIAGNOSIS — G620 Drug-induced polyneuropathy: Secondary | ICD-10-CM

## 2015-01-17 DIAGNOSIS — Z79899 Other long term (current) drug therapy: Secondary | ICD-10-CM

## 2015-01-17 DIAGNOSIS — Z87891 Personal history of nicotine dependence: Secondary | ICD-10-CM

## 2015-01-17 DIAGNOSIS — G4700 Insomnia, unspecified: Secondary | ICD-10-CM

## 2015-01-17 DIAGNOSIS — C61 Malignant neoplasm of prostate: Secondary | ICD-10-CM

## 2015-01-17 DIAGNOSIS — C7951 Secondary malignant neoplasm of bone: Secondary | ICD-10-CM

## 2015-01-17 DIAGNOSIS — T451X5S Adverse effect of antineoplastic and immunosuppressive drugs, sequela: Secondary | ICD-10-CM

## 2015-01-17 DIAGNOSIS — Z7982 Long term (current) use of aspirin: Secondary | ICD-10-CM

## 2015-01-17 DIAGNOSIS — R531 Weakness: Secondary | ICD-10-CM

## 2015-01-17 LAB — CBC WITH DIFFERENTIAL/PLATELET
Basophils Absolute: 0 10*3/uL (ref 0–0.1)
Basophils Relative: 1 %
Eosinophils Absolute: 0 10*3/uL (ref 0–0.7)
Eosinophils Relative: 0 %
HCT: 37.9 % — ABNORMAL LOW (ref 40.0–52.0)
Hemoglobin: 12.2 g/dL — ABNORMAL LOW (ref 13.0–18.0)
Lymphocytes Relative: 13 %
Lymphs Abs: 0.8 10*3/uL — ABNORMAL LOW (ref 1.0–3.6)
MCH: 29.3 pg (ref 26.0–34.0)
MCHC: 32.2 g/dL (ref 32.0–36.0)
MCV: 91 fL (ref 80.0–100.0)
Monocytes Absolute: 0.3 10*3/uL (ref 0.2–1.0)
Monocytes Relative: 5 %
Neutro Abs: 4.9 10*3/uL (ref 1.4–6.5)
Neutrophils Relative %: 81 %
Platelets: 307 10*3/uL (ref 150–440)
RBC: 4.17 MIL/uL — ABNORMAL LOW (ref 4.40–5.90)
RDW: 16.1 % — ABNORMAL HIGH (ref 11.5–14.5)
WBC: 6 10*3/uL (ref 3.8–10.6)

## 2015-01-17 LAB — COMPREHENSIVE METABOLIC PANEL
ALT: 18 U/L (ref 17–63)
AST: 21 U/L (ref 15–41)
Albumin: 4.1 g/dL (ref 3.5–5.0)
Alkaline Phosphatase: 184 U/L — ABNORMAL HIGH (ref 38–126)
Anion gap: 6 (ref 5–15)
BUN: 9 mg/dL (ref 6–20)
CO2: 23 mmol/L (ref 22–32)
Calcium: 7.9 mg/dL — ABNORMAL LOW (ref 8.9–10.3)
Chloride: 108 mmol/L (ref 101–111)
Creatinine, Ser: 0.76 mg/dL (ref 0.61–1.24)
GFR calc Af Amer: >60 mL/min (ref >60)
GFR calc non Af Amer: >60 mL/min (ref >60)
Glucose, Bld: 165 mg/dL — ABNORMAL HIGH (ref 65–99)
Potassium: 4.1 mmol/L (ref 3.5–5.1)
Sodium: 137 mmol/L (ref 135–145)
Total Bilirubin: 0.6 mg/dL (ref 0.3–1.2)
Total Protein: 7 g/dL (ref 6.5–8.1)

## 2015-01-17 MED ORDER — FENTANYL 50 MCG/HR TD PT72: 50.0000 ug | MEDICATED_PATCH | TRANSDERMAL | Status: DC

## 2015-01-17 MED ORDER — SODIUM CHLORIDE 0.9 % IJ SOLN
10.0000 mL | INTRAMUSCULAR | Status: DC | PRN
  Administered 2015-01-17: 10 mL
  Filled 2015-01-17: qty 10

## 2015-01-17 MED ORDER — HEPARIN SOD (PORK) LOCK FLUSH 100 UNIT/ML IV SOLN
500.0000 [IU] | Freq: Once | INTRAVENOUS | Status: AC | PRN
  Administered 2015-01-17: 500 [IU]
  Filled 2015-01-17: qty 5

## 2015-01-17 MED ORDER — VENLAFAXINE HCL 37.5 MG PO TABS: 37.5000 mg | ORAL_TABLET | Freq: Every day | ORAL | Status: DC

## 2015-01-17 MED ORDER — SODIUM CHLORIDE 0.9 % IV SOLN
Freq: Once | INTRAVENOUS | Status: AC
  Administered 2015-01-17: 10:00:00 via INTRAVENOUS
  Filled 2015-01-17: qty 1000

## 2015-01-17 MED ORDER — DOCETAXEL CHEMO INJECTION 160 MG/16ML
37.5000 mg/m2 | Freq: Once | INTRAVENOUS | Status: AC
  Administered 2015-01-17: 80 mg via INTRAVENOUS
  Filled 2015-01-17: qty 8

## 2015-01-17 MED ORDER — SODIUM CHLORIDE 0.9 % IV SOLN
Freq: Once | INTRAVENOUS | Status: AC
  Administered 2015-01-17: 11:00:00 via INTRAVENOUS
  Filled 2015-01-17: qty 4

## 2015-01-17 MED ORDER — HYDROMORPHONE HCL 2 MG PO TABS: 2.0000 mg | ORAL_TABLET | Freq: Every day | ORAL | Status: DC

## 2015-01-17 NOTE — Telephone Encounter (Signed)
Informed that prescription is ready to pick up  

## 2015-01-17 NOTE — Progress Notes (Signed)
  Oncology Nurse Navigator Documentation    Navigator Encounter Type: Treatment (01/17/15 0900)               Treatment visit made. Would like to talkm to Dr Grayland Ormond about not sleeping well at night. Denies naps during the day. Doing well otherwide. Encouraged to talk to Dr Grayland Ormond when he makes rounds in infusion today.

## 2015-01-17 NOTE — Progress Notes (Signed)
   01/17/15 1015  Clinical Encounter Type  Visited With Patient and family together  Visit Type Initial  Spiritual Encounters  Spiritual Needs Prayer;Emotional  Provided pastoral support, presence and prayer to patient and his wife in the cancer center.  [place] [phone]

## 2015-01-23 NOTE — Progress Notes (Signed)
[place]  Telephone:(336) [phone] Fax:(336) [phone]  ID: Enis Gash OB: 1956/06/09  MR#: [id number]  PFX#:[id number]  Patient Care Team: Tracie Harrier, MD as PCP - General (Internal Medicine)  CHIEF COMPLAINT:  No chief complaint on file.   INTERVAL HISTORY: Patient returns to clinic today for consideration of cycle 10 of Taxotere for his metastatic prostate cancer. Patient initiated treatment on June 12, 2014. He continues to have bilateral peripheral neuropathy, but this is unchanged. He does not complain of weakness or fatigue today. He denies any fevers. He has a good appetite. He denies any chest pain or shortness of breath. He denies any nausea, vomiting, constipation, or diarrhea. He has no urinary complaints. Patient offers no further specific complaints today.   REVIEW OF SYSTEMS:   Review of Systems  Constitutional: Negative for fever and weight loss.  Respiratory: Negative.   Cardiovascular: Negative.   Gastrointestinal: Negative.   Neurological: Positive for weakness.    As per HPI. Otherwise, a complete review of systems is negatve.  PAST MEDICAL HISTORY: Past Medical History  Diagnosis Date  . Prostate cancer   . Anemia     PAST SURGICAL HISTORY: Past Surgical History  Procedure Laterality Date  . Hernia repair      FAMILY HISTORY: Reviewed and unchanged. No reported history of malignancy or chronic disease.     ADVANCED DIRECTIVES:    HEALTH MAINTENANCE: History  Substance Use Topics  . Smoking status: Former Smoker    Types: Cigarettes    Quit date: 09/25/2013  . Smokeless tobacco: Not on file  . Alcohol Use: No     Colonoscopy:  PAP:  Bone density:  Lipid panel:  Allergies  Allergen Reactions  . No Known Allergies     Current Outpatient Prescriptions  Medication Sig Dispense Refill  . albuterol (PROVENTIL HFA;VENTOLIN HFA) 108 (90 BASE) MCG/ACT inhaler Inhale 90 mcg into the lungs as needed. 2  puffs by mouth every 6 hours as needed for wheezing    . aspirin 81 MG tablet Take 81 mg by mouth daily. In the morning    . calcium gluconate 500 MG tablet Take 1 tablet (500 mg total) by mouth 3 (three) times daily. 90 tablet 2  . dexamethasone (DECADRON) 4 MG tablet At [facility] take the night before chemotherapy treatments 30 tablet 0  . diphenhydrAMINE (BENADRYL) 25 MG tablet Take 25 mg by mouth every 8 (eight) hours as needed for itching.    . docusate sodium (COLACE) 100 MG capsule Take 100 mg by mouth 3 (three) times daily. As needed for constipation    . ENSURE (ENSURE) Take 1 Can by mouth 3 (three) times daily between meals.    . fentaNYL (DURAGESIC - DOSED MCG/HR) 100 MCG/HR Place 1 patch (100 mcg total) onto the skin every 3 (three) days. 10 patch 0  . fentaNYL (DURAGESIC - DOSED MCG/HR) 50 MCG/HR Place 1 patch (50 mcg total) onto the skin every 3 (three) days. 10 patch 0  . gabapentin (NEURONTIN) 300 MG capsule Take 1 capsule (300 mg total) by mouth 3 (three) times daily. At bedtme 90 capsule 1  . HYDROmorphone (DILAUDID) 2 MG tablet Take 1 tablet (2 mg total) by mouth daily. 30 tablet 0  . lidocaine-prilocaine (EMLA) cream     . loratadine (CLARITIN) 10 MG tablet Take 10 mg by mouth daily. 1 tablet orally once a day (only on the day before chemotherapy, the day of chemotherapy and the day after chemotherapy.)    .  metoprolol (LOPRESSOR) 50 MG tablet Take 1 tablet (50 mg total) by mouth 2 (two) times daily. 60 tablet 2  . polyethylene glycol (MIRALAX / GLYCOLAX) packet Take 17 g by mouth 2 (two) times daily.    . predniSONE (DELTASONE) 5 MG tablet Take 1 tablet (5 mg total) by mouth 2 (two) times daily. 60 tablet 0  . ranitidine (ZANTAC) 150 MG tablet Take 1 tablet (150 mg total) by mouth 2 (two) times daily. 60 tablet 2  . venlafaxine (EFFEXOR) 37.5 MG tablet Take 1 tablet (37.5 mg total) by mouth at bedtime. 30 tablet 2  . Vitamin D, Ergocalciferol, (DRISDOL) 50000 UNITS CAPS  capsule Take 50,000 Units by mouth every 7 (seven) days.     No current facility-administered medications for this visit.   Facility-Administered Medications Ordered in Other Visits  Medication Dose Route Frequency Provider Last Rate Last Dose  . sodium chloride 0.9 % injection 10 mL  10 mL Intravenous PRN Lloyd Huger, MD   10 mL at 01/10/15 1508    OBJECTIVE: There were no vitals filed for this visit.   There is no weight on file to calculate BMI.    ECOG FS:1 - Symptomatic but completely ambulatory  General: Well-developed, well-nourished, no acute distress. Eyes: anicteric sclera. Lungs: Clear to auscultation bilaterally. Heart: Regular rate and rhythm. No rubs, murmurs, or gallops. Abdomen: Soft, nontender, nondistended. No organomegaly noted, normoactive bowel sounds. Musculoskeletal: No edema, cyanosis, or clubbing. Neuro: Alert, answering all questions appropriately. Cranial nerves grossly intact. Skin: No rashes or petechiae noted. Psych: Normal affect.   LAB RESULTS:  Lab Results  Component Value Date   NA 137 01/17/2015   K 4.1 01/17/2015   CL 108 01/17/2015   CO2 23 01/17/2015   GLUCOSE 165* 01/17/2015   BUN 9 01/17/2015   CREATININE 0.76 01/17/2015   CALCIUM 7.9* 01/17/2015   PROT 7.0 01/17/2015   ALBUMIN 4.1 01/17/2015   AST 21 01/17/2015   ALT 18 01/17/2015   ALKPHOS 184* 01/17/2015   BILITOT 0.6 01/17/2015   GFRNONAA >60 01/17/2015   GFRAA >60 01/17/2015    Lab Results  Component Value Date   WBC 6.0 01/17/2015   NEUTROABS 4.9 01/17/2015   HGB 12.2* 01/17/2015   HCT 37.9* 01/17/2015   MCV 91.0 01/17/2015   PLT 307 01/17/2015     STUDIES: No results found.  ASSESSMENT: Stage IV prostate cancer with diffuse retroperitoneal lymphadenopathy as well as bony metastasis.  PLAN:    1. Prostate cancer: CT scan results after cycle 6 reviewed independently with improved disease burden. Patient's PSA continues to be less than 0.1 despite  evidence of metastatic disease.  Proceed with cycle 10 of dose reduced Taxotere. Patient also receives 80mg  degarelix and 4mg  Zometa on odd numbered cycles. Return to clinic in 3 weeks for further evaluation and consideration of cycle 11.  Patient recently completed a course of palliative XRT. If progression on Taxotere, can consider cabazitaxel in the future. Plan to reimage at the conclusion of cycle 12. 2. Pain: Continue current narcotic regimen. 3. Peripheral neuropathy: Dose reduced Taxotere as above, monitor.  Patient expressed understanding and was in agreement with this plan. He also understands that He can call clinic at any time with any questions, concerns, or complaints.     Lloyd Huger, MD   01/23/2015 11:11 PM

## 2015-02-06 ENCOUNTER — Other Ambulatory Visit: Payer: Self-pay | Admitting: *Deleted

## 2015-02-06 DIAGNOSIS — C61 Malignant neoplasm of prostate: Secondary | ICD-10-CM

## 2015-02-07 ENCOUNTER — Other Ambulatory Visit: Payer: Self-pay | Admitting: *Deleted

## 2015-02-07 ENCOUNTER — Inpatient Hospital Stay: Payer: BLUE CROSS/BLUE SHIELD | Attending: Internal Medicine

## 2015-02-07 ENCOUNTER — Inpatient Hospital Stay: Payer: BLUE CROSS/BLUE SHIELD

## 2015-02-07 ENCOUNTER — Inpatient Hospital Stay (HOSPITAL_BASED_OUTPATIENT_CLINIC_OR_DEPARTMENT_OTHER): Payer: BLUE CROSS/BLUE SHIELD | Admitting: Oncology

## 2015-02-07 VITALS — BP 116/77 | HR 92 | Temp 97.5°F | Resp 20 | Wt 222.4 lb

## 2015-02-07 VITALS — BP 108/66 | HR 88

## 2015-02-07 DIAGNOSIS — E119 Type 2 diabetes mellitus without complications: Secondary | ICD-10-CM | POA: Diagnosis not present

## 2015-02-07 DIAGNOSIS — Z87891 Personal history of nicotine dependence: Secondary | ICD-10-CM

## 2015-02-07 DIAGNOSIS — Z5111 Encounter for antineoplastic chemotherapy: Secondary | ICD-10-CM | POA: Insufficient documentation

## 2015-02-07 DIAGNOSIS — Z79899 Other long term (current) drug therapy: Secondary | ICD-10-CM | POA: Insufficient documentation

## 2015-02-07 DIAGNOSIS — C7951 Secondary malignant neoplasm of bone: Secondary | ICD-10-CM

## 2015-02-07 DIAGNOSIS — Z452 Encounter for adjustment and management of vascular access device: Secondary | ICD-10-CM | POA: Diagnosis not present

## 2015-02-07 DIAGNOSIS — C61 Malignant neoplasm of prostate: Secondary | ICD-10-CM | POA: Diagnosis not present

## 2015-02-07 DIAGNOSIS — T451X5S Adverse effect of antineoplastic and immunosuppressive drugs, sequela: Secondary | ICD-10-CM | POA: Insufficient documentation

## 2015-02-07 DIAGNOSIS — G620 Drug-induced polyneuropathy: Secondary | ICD-10-CM | POA: Diagnosis not present

## 2015-02-07 DIAGNOSIS — Z7982 Long term (current) use of aspirin: Secondary | ICD-10-CM

## 2015-02-07 DIAGNOSIS — R531 Weakness: Secondary | ICD-10-CM | POA: Diagnosis not present

## 2015-02-07 DIAGNOSIS — C772 Secondary and unspecified malignant neoplasm of intra-abdominal lymph nodes: Principal | ICD-10-CM

## 2015-02-07 LAB — CBC WITH DIFFERENTIAL/PLATELET
Basophils Absolute: 0 10*3/uL (ref 0–0.1)
Basophils Relative: 0 %
Eosinophils Absolute: 0 10*3/uL (ref 0–0.7)
Eosinophils Relative: 0 %
HEMATOCRIT: 37.6 % — AB (ref 40.0–52.0)
HEMOGLOBIN: 12.3 g/dL — AB (ref 13.0–18.0)
LYMPHS ABS: 0.8 10*3/uL — AB (ref 1.0–3.6)
Lymphocytes Relative: 12 %
MCH: 29.1 pg (ref 26.0–34.0)
MCHC: 32.9 g/dL (ref 32.0–36.0)
MCV: 88.6 fL (ref 80.0–100.0)
MONOS PCT: 3 %
Monocytes Absolute: 0.2 10*3/uL (ref 0.2–1.0)
NEUTROS ABS: 5.5 10*3/uL (ref 1.4–6.5)
NEUTROS PCT: 85 %
Platelets: 274 10*3/uL (ref 150–440)
RBC: 4.24 MIL/uL — ABNORMAL LOW (ref 4.40–5.90)
RDW: 16.2 % — ABNORMAL HIGH (ref 11.5–14.5)
WBC: 6.5 10*3/uL (ref 3.8–10.6)

## 2015-02-07 LAB — COMPREHENSIVE METABOLIC PANEL
ALK PHOS: 147 U/L — AB (ref 38–126)
ALT: 24 U/L (ref 17–63)
ANION GAP: 7 (ref 5–15)
AST: 26 U/L (ref 15–41)
Albumin: 3.9 g/dL (ref 3.5–5.0)
BILIRUBIN TOTAL: 0.4 mg/dL (ref 0.3–1.2)
BUN: 11 mg/dL (ref 6–20)
CALCIUM: 8.3 mg/dL — AB (ref 8.9–10.3)
CO2: 23 mmol/L (ref 22–32)
Chloride: 108 mmol/L (ref 101–111)
Creatinine, Ser: 0.74 mg/dL (ref 0.61–1.24)
GLUCOSE: 213 mg/dL — AB (ref 65–99)
Potassium: 3.8 mmol/L (ref 3.5–5.1)
Sodium: 138 mmol/L (ref 135–145)
TOTAL PROTEIN: 6.9 g/dL (ref 6.5–8.1)

## 2015-02-07 MED ORDER — RANITIDINE HCL 150 MG PO TABS: 150.0000 mg | ORAL_TABLET | Freq: Two times a day (BID) | ORAL | Status: DC

## 2015-02-07 MED ORDER — SODIUM CHLORIDE 0.9 % IJ SOLN
10.0000 mL | INTRAMUSCULAR | Status: DC | PRN
  Administered 2015-02-07: 10 mL via INTRAVENOUS
  Filled 2015-02-07: qty 10

## 2015-02-07 MED ORDER — FENTANYL 50 MCG/HR TD PT72: 50.0000 ug | MEDICATED_PATCH | TRANSDERMAL | Status: DC

## 2015-02-07 MED ORDER — DEGARELIX ACETATE 80 MG ~~LOC~~ SOLR
80.0000 mg | Freq: Once | SUBCUTANEOUS | Status: AC
Start: 2015-02-07 — End: 2015-02-07
  Administered 2015-02-07: 80 mg via SUBCUTANEOUS
  Filled 2015-02-07: qty 4

## 2015-02-07 MED ORDER — HEPARIN SOD (PORK) LOCK FLUSH 100 UNIT/ML IV SOLN
500.0000 [IU] | Freq: Once | INTRAVENOUS | Status: AC
  Administered 2015-02-07: 500 [IU] via INTRAVENOUS
  Filled 2015-02-07: qty 5

## 2015-02-07 MED ORDER — DENOSUMAB 120 MG/1.7ML ~~LOC~~ SOLN
120.0000 mg | Freq: Once | SUBCUTANEOUS | Status: AC
Start: 2015-02-07 — End: 2015-02-07
  Administered 2015-02-07: 120 mg via SUBCUTANEOUS
  Filled 2015-02-07: qty 1.7

## 2015-02-07 MED ORDER — DOCETAXEL CHEMO INJECTION 160 MG/16ML
37.5000 mg/m2 | Freq: Once | INTRAVENOUS | Status: AC
  Administered 2015-02-07: 80 mg via INTRAVENOUS
  Filled 2015-02-07: qty 8

## 2015-02-07 MED ORDER — FENTANYL 100 MCG/HR TD PT72: 100.0000 ug | MEDICATED_PATCH | TRANSDERMAL | Status: DC

## 2015-02-07 MED ORDER — SODIUM CHLORIDE 0.9 % IJ SOLN
10.0000 mL | INTRAMUSCULAR | Status: DC | PRN
  Filled 2015-02-07: qty 10

## 2015-02-07 MED ORDER — GABAPENTIN 300 MG PO CAPS: 300.0000 mg | ORAL_CAPSULE | Freq: Three times a day (TID) | ORAL | Status: AC

## 2015-02-07 MED ORDER — SODIUM CHLORIDE 0.9 % IV SOLN
Freq: Once | INTRAVENOUS | Status: AC
  Administered 2015-02-07: 12:00:00 via INTRAVENOUS
  Filled 2015-02-07: qty 4

## 2015-02-07 MED ORDER — CALCIUM GLUCONATE 500 MG PO TABS: 1.0000 | ORAL_TABLET | Freq: Three times a day (TID) | ORAL | Status: DC

## 2015-02-07 MED ORDER — SODIUM CHLORIDE 0.9 % IV SOLN
Freq: Once | INTRAVENOUS | Status: AC
  Administered 2015-02-07: 11:00:00 via INTRAVENOUS
  Filled 2015-02-07: qty 1000

## 2015-02-07 MED ORDER — HEPARIN SOD (PORK) LOCK FLUSH 100 UNIT/ML IV SOLN
500.0000 [IU] | Freq: Once | INTRAVENOUS | Status: AC | PRN
  Administered 2015-02-07: 500 [IU]

## 2015-02-09 NOTE — Progress Notes (Signed)
[place]  Telephone:(336) [phone] Fax:(336) [phone]  ID: Anthony [name] OB: [birth year]  MR#: [id number]  JIR#:[id number]  Patient Care Team: Tracie Harrier, MD as PCP - General (Internal Medicine)  CHIEF COMPLAINT:  Chief Complaint  Patient presents with  . Follow-up    prostate cancer    INTERVAL HISTORY: Patient returns to clinic today for consideration of cycle 11 of Taxotere for his metastatic prostate cancer. Patient initiated treatment on June 12, 2014. He continues to have bilateral peripheral neuropathy, but this is unchanged. He does not complain of weakness or fatigue today. He denies any fevers. He has a good appetite. He denies any chest pain or shortness of breath. He denies any nausea, vomiting, constipation, or diarrhea. He has no urinary complaints. Patient offers no further specific complaints today.   REVIEW OF SYSTEMS:   Review of Systems  Constitutional: Negative for fever and weight loss.  Respiratory: Negative.   Cardiovascular: Negative.   Gastrointestinal: Negative.   Neurological: Positive for weakness.    As per HPI. Otherwise, a complete review of systems is negatve.  PAST MEDICAL HISTORY: Past Medical History  Diagnosis Date  . Prostate cancer   . Anemia     PAST SURGICAL HISTORY: Past Surgical History  Procedure Laterality Date  . Hernia repair      FAMILY HISTORY: Reviewed and unchanged. No reported history of malignancy or chronic disease.     ADVANCED DIRECTIVES:    HEALTH MAINTENANCE: Social History  Substance Use Topics  . Smoking status: Former Smoker    Types: Cigarettes    Quit date: 09/25/2013  . Smokeless tobacco: Not on file  . Alcohol Use: No     Colonoscopy:  PAP:  Bone density:  Lipid panel:  Allergies  Allergen Reactions  . No Known Allergies     Current Outpatient Prescriptions  Medication Sig Dispense Refill  . albuterol (PROVENTIL HFA;VENTOLIN HFA) 108 (90 BASE) MCG/ACT  inhaler Inhale 90 mcg into the lungs as needed. 2 puffs by mouth every 6 hours as needed for wheezing    . aspirin 81 MG tablet Take 81 mg by mouth daily. In the morning    . dexamethasone (DECADRON) 4 MG tablet At [facility] take the night before chemotherapy treatments 30 tablet 0  . diphenhydrAMINE (BENADRYL) 25 MG tablet Take 25 mg by mouth every 8 (eight) hours as needed for itching.    . docusate sodium (COLACE) 100 MG capsule Take 100 mg by mouth 3 (three) times daily. As needed for constipation    . ENSURE (ENSURE) Take 1 Can by mouth 3 (three) times daily between meals.    Marland Kitchen HYDROmorphone (DILAUDID) 2 MG tablet Take 1 tablet (2 mg total) by mouth daily. 30 tablet 0  . lidocaine-prilocaine (EMLA) cream     . loratadine (CLARITIN) 10 MG tablet Take 10 mg by mouth daily. 1 tablet orally once a day (only on the day before chemotherapy, the day of chemotherapy and the day after chemotherapy.)    . metoprolol (LOPRESSOR) 50 MG tablet Take 1 tablet (50 mg total) by mouth 2 (two) times daily. 60 tablet 2  . polyethylene glycol (MIRALAX / GLYCOLAX) packet Take 17 g by mouth 2 (two) times daily.    . predniSONE (DELTASONE) 5 MG tablet Take 1 tablet (5 mg total) by mouth 2 (two) times daily. 60 tablet 0  . venlafaxine (EFFEXOR) 37.5 MG tablet Take 1 tablet (37.5 mg total) by mouth at bedtime. 30 tablet 2  .  Vitamin D, Ergocalciferol, (DRISDOL) 50000 UNITS CAPS capsule Take 50,000 Units by mouth every 7 (seven) days.    . calcium gluconate 500 MG tablet Take 1 tablet (500 mg total) by mouth 3 (three) times daily. 90 tablet 2  . fentaNYL (DURAGESIC - DOSED MCG/HR) 100 MCG/HR Place 1 patch (100 mcg total) onto the skin every 3 (three) days. 10 patch 0  . fentaNYL (DURAGESIC - DOSED MCG/HR) 50 MCG/HR Place 1 patch (50 mcg total) onto the skin every 3 (three) days. 10 patch 0  . gabapentin (NEURONTIN) 300 MG capsule Take 1 capsule (300 mg total) by mouth 3 (three) times daily. At bedtme 90 capsule 1  .  ranitidine (ZANTAC) 150 MG tablet Take 1 tablet (150 mg total) by mouth 2 (two) times daily. 60 tablet 2   No current facility-administered medications for this visit.   Facility-Administered Medications Ordered in Other Visits  Medication Dose Route Frequency Provider Last Rate Last Dose  . sodium chloride 0.9 % injection 10 mL  10 mL Intravenous PRN Lloyd Huger, MD   10 mL at 01/10/15 1508    OBJECTIVE: Filed Vitals:   02/07/15 1031  BP: 116/77  Pulse: 92  Temp: 97.5 F (36.4 C)  Resp: 20     Body mass index is 31.92 kg/(m^2).    ECOG FS:1 - Symptomatic but completely ambulatory  General: Well-developed, well-nourished, no acute distress. Eyes: anicteric sclera. Lungs: Clear to auscultation bilaterally. Heart: Regular rate and rhythm. No rubs, murmurs, or gallops. Abdomen: Soft, nontender, nondistended. No organomegaly noted, normoactive bowel sounds. Musculoskeletal: No edema, cyanosis, or clubbing. Neuro: Alert, answering all questions appropriately. Cranial nerves grossly intact. Skin: No rashes or petechiae noted. Psych: Normal affect.   LAB RESULTS:  Lab Results  Component Value Date   NA 138 02/07/2015   K 3.8 02/07/2015   CL 108 02/07/2015   CO2 23 02/07/2015   GLUCOSE 213* 02/07/2015   BUN 11 02/07/2015   CREATININE 0.74 02/07/2015   CALCIUM 8.3* 02/07/2015   PROT 6.9 02/07/2015   ALBUMIN 3.9 02/07/2015   AST 26 02/07/2015   ALT 24 02/07/2015   ALKPHOS 147* 02/07/2015   BILITOT 0.4 02/07/2015   GFRNONAA >60 02/07/2015   GFRAA >60 02/07/2015    Lab Results  Component Value Date   WBC 6.5 02/07/2015   NEUTROABS 5.5 02/07/2015   HGB 12.3* 02/07/2015   HCT 37.6* 02/07/2015   MCV 88.6 02/07/2015   PLT 274 02/07/2015     STUDIES: No results found.  ASSESSMENT: Stage IV prostate cancer with diffuse retroperitoneal lymphadenopathy as well as bony metastasis.  PLAN:    1. Prostate cancer: CT scan results after cycle 6 reviewed  independently with improved disease burden. Patient's PSA continues to be less than 0.1 despite evidence of metastatic disease.  Proceed with cycle 11 of dose reduced Taxotere. Patient also receives 80mg  degarelix and 4mg  Zometa on odd numbered cycles. Return to clinic in 3 weeks for further evaluation and consideration of cycle 12.  Patient recently completed a course of palliative XRT. If progression on Taxotere, can consider cabazitaxel in the future. Plan to reimage at the conclusion of cycle 12. 2. Pain: Continue current narcotic regimen. 3. Peripheral neuropathy: Dose reduced Taxotere as above, monitor. 4. Hyperglycemia: Continue current diabetic medications as prescribed.  Patient expressed understanding and was in agreement with this plan. He also understands that He can call clinic at any time with any questions, concerns, or complaints.  Lloyd Huger, MD   02/09/2015 3:43 PM

## 2015-02-10 ENCOUNTER — Other Ambulatory Visit: Payer: Self-pay

## 2015-02-10 ENCOUNTER — Emergency Department
Admission: EM | Admit: 2015-02-10 | Discharge: 2015-02-11 | Disposition: A | Discharge status: Home / Self Care | Payer: BLUE CROSS/BLUE SHIELD | Attending: Emergency Medicine | Admitting: Emergency Medicine

## 2015-02-10 DIAGNOSIS — Z7952 Long term (current) use of systemic steroids: Secondary | ICD-10-CM | POA: Insufficient documentation

## 2015-02-10 DIAGNOSIS — I471 Supraventricular tachycardia: Secondary | ICD-10-CM | POA: Diagnosis not present

## 2015-02-10 DIAGNOSIS — Z79891 Long term (current) use of opiate analgesic: Secondary | ICD-10-CM | POA: Insufficient documentation

## 2015-02-10 DIAGNOSIS — Z7982 Long term (current) use of aspirin: Secondary | ICD-10-CM | POA: Diagnosis not present

## 2015-02-10 DIAGNOSIS — Z79899 Other long term (current) drug therapy: Secondary | ICD-10-CM | POA: Insufficient documentation

## 2015-02-10 DIAGNOSIS — R000 Tachycardia, unspecified: Secondary | ICD-10-CM | POA: Diagnosis present

## 2015-02-10 DIAGNOSIS — Z87891 Personal history of nicotine dependence: Secondary | ICD-10-CM | POA: Insufficient documentation

## 2015-02-10 DIAGNOSIS — L03311 Cellulitis of abdominal wall: Secondary | ICD-10-CM

## 2015-02-10 HISTORY — DX: Cardiac murmur, unspecified: R01.1

## 2015-02-10 LAB — CBC WITH DIFFERENTIAL/PLATELET
Basophils Absolute: 0 10*3/uL (ref 0–0.1)
Basophils Relative: 1 %
Eosinophils Absolute: 0 10*3/uL (ref 0–0.7)
Eosinophils Relative: 1 %
HEMATOCRIT: 38.7 % — AB (ref 40.0–52.0)
HEMOGLOBIN: 12.4 g/dL — AB (ref 13.0–18.0)
LYMPHS ABS: 1.3 10*3/uL (ref 1.0–3.6)
Lymphocytes Relative: 33 %
MCH: 28.8 pg (ref 26.0–34.0)
MCHC: 32 g/dL (ref 32.0–36.0)
MCV: 89.9 fL (ref 80.0–100.0)
MONOS PCT: 7 %
Monocytes Absolute: 0.3 10*3/uL (ref 0.2–1.0)
NEUTROS ABS: 2.2 10*3/uL (ref 1.4–6.5)
NEUTROS PCT: 58 %
Platelets: 255 10*3/uL (ref 150–440)
RBC: 4.3 MIL/uL — AB (ref 4.40–5.90)
RDW: 16.5 % — ABNORMAL HIGH (ref 11.5–14.5)
WBC: 3.8 10*3/uL (ref 3.8–10.6)

## 2015-02-10 LAB — GLUCOSE, CAPILLARY: GLUCOSE-CAPILLARY: 148 mg/dL — AB (ref 65–99)

## 2015-02-10 MED ORDER — SODIUM CHLORIDE 0.9 % IV BOLUS (SEPSIS)
1000.0000 mL | Freq: Once | INTRAVENOUS | Status: AC
  Administered 2015-02-10: 1000 mL via INTRAVENOUS

## 2015-02-10 NOTE — ED Notes (Signed)
Pt states onset of tachycardia at rest this pm. Pt states became shob and nauseated. Pt arrives to ed sweaty, nauseated, ems states pt was in svt on their arrival. Ems states hr of 180 that converted with 6mg  adenocard. Pt denies pain currently.

## 2015-02-11 ENCOUNTER — Encounter: Payer: Self-pay | Admitting: Emergency Medicine

## 2015-02-11 LAB — COMPREHENSIVE METABOLIC PANEL
ALBUMIN: 3.3 g/dL — AB (ref 3.5–5.0)
ALT: 32 U/L (ref 17–63)
ANION GAP: 6 (ref 5–15)
AST: 35 U/L (ref 15–41)
Alkaline Phosphatase: 129 U/L — ABNORMAL HIGH (ref 38–126)
BILIRUBIN TOTAL: 0.3 mg/dL (ref 0.3–1.2)
BUN: 13 mg/dL (ref 6–20)
CO2: 23 mmol/L (ref 22–32)
Calcium: 7.8 mg/dL — ABNORMAL LOW (ref 8.9–10.3)
Chloride: 109 mmol/L (ref 101–111)
Creatinine, Ser: 0.99 mg/dL (ref 0.61–1.24)
GFR calc Af Amer: >60 mL/min (ref >60)
GFR calc non Af Amer: >60 mL/min (ref >60)
GLUCOSE: 134 mg/dL — AB (ref 65–99)
POTASSIUM: 4 mmol/L (ref 3.5–5.1)
SODIUM: 138 mmol/L (ref 135–145)
TOTAL PROTEIN: 5.9 g/dL — AB (ref 6.5–8.1)

## 2015-02-11 LAB — TROPONIN I: Troponin I: <0.03 ng/mL (ref <0.031)

## 2015-02-11 LAB — MAGNESIUM: Magnesium: 2.1 mg/dL (ref 1.7–2.4)

## 2015-02-11 MED ORDER — CEPHALEXIN 500 MG PO CAPS: 500.0000 mg | ORAL_CAPSULE | Freq: Three times a day (TID) | ORAL | Status: DC

## 2015-02-11 MED ORDER — CEPHALEXIN 500 MG PO CAPS
500.0000 mg | ORAL_CAPSULE | Freq: Once | ORAL | Status: AC
  Administered 2015-02-11: 500 mg via ORAL
  Filled 2015-02-11: qty 1

## 2015-02-11 NOTE — ED Notes (Signed)
Pt declines offer to be readjusted in bed for comfort. Pt denies pain currently. Repeat troponin obtained. Call bell at side, po fluids provided.

## 2015-02-11 NOTE — Discharge Instructions (Signed)
Supraventricular Tachycardia Supraventricular tachycardia (SVT) is an abnormal heart rhythm (arrhythmia) that causes the heart to beat very fast (tachycardia). This kind of fast heartbeat originates in the upper chambers of the heart (atria). SVT can cause the heart to beat greater than 100 beats per minute. SVT can have a rapid burst of heartbeats. This can start and stop suddenly without warning and is called nonsustained. SVT can also be sustained, in which the heart beats at a continuous fast rate.  CAUSES  There can be different causes of SVT. Some of these include:  Heart valve problems such as mitral valve prolapse.  An enlarged heart (hypertrophic cardiomyopathy).  Congenital heart problems.  Heart inflammation (pericarditis).  Hyperthyroidism.  Low potassium or magnesium levels.  Caffeine.  Drug use such as cocaine, methamphetamines, or stimulants.  Some over-the-counter medicines such as:  Decongestants.  Diet medicines.  Herbal medicines. SYMPTOMS  Symptoms of SVT can vary. Symptoms depend on whether the SVT is sustained or nonsustained. You may experience:  No symptoms (asymptomatic).  An awareness of your heart beating rapidly (palpitations).  Shortness of breath.  Chest pain or pressure. If your blood pressure drops because of the SVT, you may experience:  Fainting or near fainting.  Weakness.  Dizziness. DIAGNOSIS  Different tests can be performed to diagnose SVT, such as:  An electrocardiogram (EKG). This is a painless test that records the electrical activity of your heart.  Holter monitor. This is a 24 hour recording of your heart rhythm. You will be given a diary. Write down all symptoms that you have and what you were doing at the time you experienced symptoms.  Arrhythmia monitor. This is a small device that your wear for several weeks. It records the heart rhythm when you have symptoms.  Echocardiogram. This is an imaging test to help detect  abnormal heart structure such as congenital abnormalities, heart valve problems, or heart enlargement.  Stress test. This test can help determine if the SVT is related to exercise.  Electrophysiology study (EPS). This is a procedure that evaluates your heart's electrical system and can help your caregiver find the cause of your SVT. TREATMENT  Treatment of SVT depends on the symptoms, how often it recurs, and whether there are any underlying heart problems.   If symptoms are rare and no other cardiac disease is present, no treatment may be needed.  Blood work may be done to check potassium, magnesium, and thyroid hormone levels to see if they are abnormal. If these levels are abnormal, treatment to correct the problems will occur. Medicines Your caregiver may use oral medicines to treat SVT. These medicines are given for long-term control of SVT. Medicines may be used alone or in combination with other treatments. These medicines work to slow nerve impulses in the heart muscle. These medicines can also be used to treat high blood pressure. Some of these medicines may include:  Calcium channel blockers.  Beta blockers.  Digoxin. Nonsurgical procedures Nonsurgical techniques may be used if oral medicines do not work. Some examples include:  Cardioversion. This technique uses either drugs or an electrical shock to restore a normal heart rhythm.  Cardioversion drugs may be given through an intravenous (IV) line to help "reset" the heart rhythm.  In electrical cardioversion, the caregiver shocks your heart to stop its beat for a split second. This helps to reset the heart to a normal rhythm.  Ablation. This procedure is done under mild sedation. High frequency radio wave energy is used to  destroy the area of heart tissue responsible for the SVT. HOME CARE INSTRUCTIONS   Do not smoke.  Only take medicines prescribed by your caregiver. Check with your caregiver before using over-the-counter  medicines.  Check with your caregiver about how much alcohol and caffeine (coffee, tea, colas, or chocolate) you may have.  It is very important to keep all follow-up referrals and appointments in order to properly manage this problem. SEEK IMMEDIATE MEDICAL CARE IF:  You have dizziness.  You faint or nearly faint.  You have shortness of breath.  You have chest pain or pressure.  You have sudden nausea or vomiting.  You have profuse sweating.  You are concerned about how long your symptoms last.  You are concerned about the frequency of your SVT episodes. If you have the above symptoms, call your local emergency services (911 in [place] immediately. Do not drive yourself to the hospital. MAKE SURE YOU:   Understand these instructions.  Will watch your condition.  Will get help right away if you are not doing well or get worse. Document Released: 06/09/2005 Document Revised: 09/01/2011 Document Reviewed: 09/21/2008 [facility] Patient Information 2015 [place], [place]. This information is not intended to replace advice given to you by your health care provider. Make sure you discuss any questions you have with your health care provider.  Cellulitis Cellulitis is an infection of the skin and the tissue beneath it. The infected area is usually red and tender. Cellulitis occurs most often in the arms and lower legs.  CAUSES  Cellulitis is caused by bacteria that enter the skin through cracks or cuts in the skin. The most common types of bacteria that cause cellulitis are staphylococci and streptococci. SIGNS AND SYMPTOMS   Redness and warmth.  Swelling.  Tenderness or pain.  Fever. DIAGNOSIS  Your health care provider can usually determine what is wrong based on a physical exam. Blood tests may also be done. TREATMENT  Treatment usually involves taking an antibiotic medicine. HOME CARE INSTRUCTIONS   Take your antibiotic medicine as directed by your health care provider.  Finish the antibiotic even if you start to feel better.  Keep the infected arm or leg elevated to reduce swelling.  Apply a warm cloth to the affected area up to 4 times per day to relieve pain.  Take medicines only as directed by your health care provider.  Keep all follow-up visits as directed by your health care provider. SEEK MEDICAL CARE IF:   You notice red streaks coming from the infected area.  Your red area gets larger or turns dark in color.  Your bone or joint underneath the infected area becomes painful after the skin has healed.  Your infection returns in the same area or another area.  You notice a swollen bump in the infected area.  You develop new symptoms.  You have a fever. SEEK IMMEDIATE MEDICAL CARE IF:   You feel very sleepy.  You develop vomiting or diarrhea.  You have a general ill feeling (malaise) with muscle aches and pains. MAKE SURE YOU:   Understand these instructions.  Will watch your condition.  Will get help right away if you are not doing well or get worse. Document Released: 03/19/2005 Document Revised: 10/24/2013 Document Reviewed: 08/25/2011 [facility] Patient Information 2015 [place], [place]. This information is not intended to replace advice given to you by your health care provider. Make sure you discuss any questions you have with your health care provider.

## 2015-02-11 NOTE — ED Notes (Signed)
Pt provided with po fluids per request with dr. Jerene Canny consent.

## 2015-02-11 NOTE — ED Provider Notes (Signed)
[facility] Emergency Department Provider Note  ____________________________________________  Time seen: 11:30 PM  I have reviewed the triage vital signs and the nursing notes.   HISTORY  Chief Complaint Tachycardia and Nausea    HPI Anthony [name] is a 59 y.o. male who reports that around 10:00 PM he is taking his evening medications, and immediately after taking them he laid down flat. Within a minute or 2, he noticed that his heart rate was elevating and became very fast and felt like episodes of SVT he has had in the past. She notes that normally when he eats or takes medications, he has to sit upright for 15-30 minutes because he has severe GERD. When he lies down flat quickly after eating or drinking anything, he usually gets severe symptoms. He thinks this is the cause for his SVT episode tonight. He is otherwise been in his usual state of health without any other problems. No recent illness chest pain shortness of breath fever chills abdominal pain nausea vomiting or syncope.  During the apparent SVT episode, he notes that he felt short of breath and dizzy and sweaty. He called EMS, who quickly gave him adenosine IV, which returned his heart rate to normal and immediately felt much better. Here in the ED, he feels back to normal.  He also notes that he had a subcutaneous injection in his right lower abdomen 4 days ago related to his prostate cancer, and the area is red painful and swollen.      Past Medical History  Diagnosis Date  . Prostate cancer   . Anemia   . Heart murmur     Patient Active Problem List   Diagnosis Date Noted  . Prostate cancer 11/15/2014    Past Surgical History  Procedure Laterality Date  . Hernia repair      Current Outpatient Rx  Name  Route  Sig  Dispense  Refill  . albuterol (PROVENTIL HFA;VENTOLIN HFA) 108 (90 BASE) MCG/ACT inhaler   Inhalation   Inhale 90 mcg into the lungs as needed. 2 puffs by mouth every  6 hours as needed for wheezing         . aspirin 81 MG tablet   Oral   Take 81 mg by mouth daily. In the morning         . calcium gluconate 500 MG tablet   Oral   Take 1 tablet (500 mg total) by mouth 3 (three) times daily.   90 tablet   2   . dexamethasone (DECADRON) 4 MG tablet      At [facility] take the night before chemotherapy treatments   30 tablet   0   . diphenhydrAMINE (BENADRYL) 25 MG tablet   Oral   Take 25 mg by mouth every 8 (eight) hours as needed for itching.         . docusate sodium (COLACE) 100 MG capsule   Oral   Take 100 mg by mouth 3 (three) times daily. As needed for constipation         . ENSURE (ENSURE)   Oral   Take 1 Can by mouth 3 (three) times daily between meals.         . fentaNYL (DURAGESIC - DOSED MCG/HR) 100 MCG/HR   Transdermal   Place 1 patch (100 mcg total) onto the skin every 3 (three) days.   10 patch   0   . fentaNYL (DURAGESIC - DOSED MCG/HR) 50 MCG/HR   Transdermal  Place 1 patch (50 mcg total) onto the skin every 3 (three) days.   10 patch   0   . gabapentin (NEURONTIN) 300 MG capsule   Oral   Take 1 capsule (300 mg total) by mouth 3 (three) times daily. At bedtme   90 capsule   1   . HYDROmorphone (DILAUDID) 2 MG tablet   Oral   Take 1 tablet (2 mg total) by mouth daily.   30 tablet   0     BYRD FUND   . lidocaine-prilocaine (EMLA) cream               . loratadine (CLARITIN) 10 MG tablet   Oral   Take 10 mg by mouth daily. 1 tablet orally once a day (only on the day before chemotherapy, the day of chemotherapy and the day after chemotherapy.)         . metoprolol (LOPRESSOR) 50 MG tablet   Oral   Take 1 tablet (50 mg total) by mouth 2 (two) times daily.   60 tablet   2   . polyethylene glycol (MIRALAX / GLYCOLAX) packet   Oral   Take 17 g by mouth 2 (two) times daily.         . predniSONE (DELTASONE) 5 MG tablet   Oral   Take 1 tablet (5 mg total) by mouth 2 (two) times  daily.   60 tablet   0   . ranitidine (ZANTAC) 150 MG tablet   Oral   Take 1 tablet (150 mg total) by mouth 2 (two) times daily.   60 tablet   2   . venlafaxine (EFFEXOR) 37.5 MG tablet   Oral   Take 1 tablet (37.5 mg total) by mouth at bedtime.   30 tablet   2     BYRD FUND   . Vitamin D, Ergocalciferol, (DRISDOL) 50000 UNITS CAPS capsule   Oral   Take 50,000 Units by mouth every 7 (seven) days.         . cephALEXin (KEFLEX) 500 MG capsule   Oral   Take 1 capsule (500 mg total) by mouth 3 (three) times daily.   21 capsule   0     Allergies No known allergies  History reviewed. No pertinent family history.  Social History Social History  Substance Use Topics  . Smoking status: Former Smoker    Types: Cigarettes    Quit date: 09/25/2013  . Smokeless tobacco: None  . Alcohol Use: No    Review of Systems  Constitutional: No fever or chills. No weight changes Eyes:No blurry vision or double vision.  ENT: No sore throat. Cardiovascular: tachycardia as above , no chest pain  Respiratory:Shortness of breath as above without cough. Gastrointestinal: Negative for abdominal pain, vomiting and diarrhea.  No BRBPR or melena. Genitourinary: Negative for dysuria, urinary retention, bloody urine, or difficulty urinating. Musculoskeletal: Negative for back pain. No joint swelling or pain. Skin: Negative for rash. Neurological: Negative for headaches, focal weakness or numbness. Psychiatric:No anxiety or depression.   Endocrine:No hot/cold intolerance, changes in energy, or sleep difficulty.  10-point ROS otherwise negative.  ____________________________________________   PHYSICAL EXAM:  VITAL SIGNS: ED Triage Vitals  Enc Vitals Group     BP 02/10/15 2258 100/66 mmHg     Pulse Rate 02/10/15 2258 109     Resp 02/10/15 2258 14     Temp 02/10/15 2258 97.9 F (36.6 C)     Temp Source 02/10/15 2258 Oral  SpO2 02/10/15 2258 96 %     Weight 02/10/15 2258  222 lb (100.699 kg)     Height 02/10/15 2258 5\' 10"  (1.778 m)     Head Cir --      Peak Flow --      Pain Score 02/10/15 2258 0     Pain Loc --      Pain Edu? --      Excl. in [place]? --      Constitutional: Alert and oriented. Well appearing and in no distress. Eyes: No scleral icterus. No conjunctival pallor. PERRL. EOMI ENT   Head: Normocephalic and atraumatic.   Nose: No congestion/rhinnorhea. No septal hematoma   Mouth/Throat: MMM, no pharyngeal erythema. No peritonsillar mass. No uvula shift.   Neck: No stridor. No SubQ emphysema. No meningismus. Hematological/Lymphatic/Immunilogical: No cervical lymphadenopathy. Cardiovascular: RRR, Heart rate 90. Normal and symmetric distal pulses are present in all extremities. No murmurs, rubs, or gallops. Respiratory: Normal respiratory effort without tachypnea nor retractions. Breath sounds are clear and equal bilaterally. No wheezes/rales/rhonchi. Gastrointestinal: Soft and nontender. No distention. There is no CVA tenderness.  No rebound, rigidity, or guarding. Genitourinary: deferred Musculoskeletal: Nontender with normal range of motion in all extremities. No joint effusions.  No lower extremity tenderness.  No edema. Neurologic:   Normal speech and language.  CN 2-10 normal. Motor grossly intact. No pronator drift.  Normal gait. No gross focal neurologic deficits are appreciated.  Skin:  Skin is warm, dry and intact. No rash noted.  No petechiae, purpura, or bullae. Psychiatric: Mood and affect are normal. Speech and behavior are normal. Patient exhibits appropriate insight and judgment.  ____________________________________________    LABS (pertinent positives/negatives) (all labs ordered are listed, but only abnormal results are displayed) Labs Reviewed  CBC WITH DIFFERENTIAL/PLATELET - Abnormal; Notable for the following:    RBC 4.30 (*)    Hemoglobin 12.4 (*)    HCT 38.7 (*)    RDW 16.5 (*)    All other  components within normal limits  GLUCOSE, CAPILLARY - Abnormal; Notable for the following:    Glucose-Capillary 148 (*)    All other components within normal limits  COMPREHENSIVE METABOLIC PANEL - Abnormal; Notable for the following:    Glucose, Bld 134 (*)    Calcium 7.8 (*)    Total Protein 5.9 (*)    Albumin 3.3 (*)    Alkaline Phosphatase 129 (*)    All other components within normal limits  TROPONIN I  TROPONIN I  MAGNESIUM   ____________________________________________   EKG  Interpreted by me Sinus tachycardia rate 110, normal axis intervals QRS and ST segments. There are T-wave inversions in 3 and aVF. This appears to be new compared to November 2015.   ____________________________________________    RADIOLOGY    ____________________________________________   PROCEDURES  ____________________________________________   INITIAL IMPRESSION / ASSESSMENT AND PLAN / ED COURSE  Pertinent labs & imaging results that were available during my care of the patient were reviewed by me and considered in my medical decision making (see chart for details).  Patient presents for evaluation after SVT which was successfully treated by EMS. He now feels back to normal. He's not had any exertional symptoms or other anginal equivalent symptoms recently. The symptom onset was related to rest and lying supine. This appears to be related to GERD causing esophageal spasm and associated cardiac irritability. He is hemodynamically stable at this time with normal vital signs. We will check troponins for 2 sets and  other labs and monitor in the ED.  ----------------------------------------- 2:57 AM on 02/11/2015 -----------------------------------------  Labs unremarkable. No recurrence of symptoms in the ED. Patient feels completely at baseline. Vital signs are unremarkable, with normal blood pressure and heart rate of 80. The patient home to follow up with primary care and his cancer  Center team.  ____________________________________________   FINAL CLINICAL IMPRESSION(S) / ED DIAGNOSES  Final diagnoses:  SVT (supraventricular tachycardia)  Cellulitis, abdominal wall      Carrie Mew, MD 02/11/15 [phone]

## 2015-02-12 ENCOUNTER — Other Ambulatory Visit: Payer: Self-pay

## 2015-02-12 ENCOUNTER — Telehealth: Payer: Self-pay | Admitting: *Deleted

## 2015-02-12 NOTE — Telephone Encounter (Signed)
Spoke with patients wife on the phone regarding medications, stating patient is refusing to take some of his medications. Patient and family to come in tomorrow morning to discuss current medications.

## 2015-02-12 NOTE — Telephone Encounter (Signed)
Had lengthy discussion with pt's wife that pt went to ER on Saturday night due to shortness of breath when lying flat. Shortness of breath resolves when pt sitting upright. Also, pt confused about medications and pt's wife would like RN or MD to call pt to discuss reason for taking medications.

## 2015-02-21 ENCOUNTER — Inpatient Hospital Stay: Payer: BLUE CROSS/BLUE SHIELD

## 2015-02-21 DIAGNOSIS — C61 Malignant neoplasm of prostate: Secondary | ICD-10-CM | POA: Diagnosis not present

## 2015-02-21 DIAGNOSIS — C801 Malignant (primary) neoplasm, unspecified: Secondary | ICD-10-CM

## 2015-02-21 MED ORDER — SODIUM CHLORIDE 0.9 % IJ SOLN
10.0000 mL | Freq: Once | INTRAMUSCULAR | Status: AC
  Administered 2015-02-21: 10 mL via INTRAVENOUS
  Filled 2015-02-21: qty 10

## 2015-02-21 MED ORDER — HEPARIN SOD (PORK) LOCK FLUSH 100 UNIT/ML IV SOLN
INTRAVENOUS | Status: AC
  Filled 2015-02-21: qty 5

## 2015-02-21 MED ORDER — HEPARIN SOD (PORK) LOCK FLUSH 100 UNIT/ML IV SOLN
500.0000 [IU] | Freq: Once | INTRAVENOUS | Status: AC
  Administered 2015-02-21: 500 [IU] via INTRAVENOUS

## 2015-02-28 ENCOUNTER — Inpatient Hospital Stay: Payer: BLUE CROSS/BLUE SHIELD

## 2015-02-28 ENCOUNTER — Inpatient Hospital Stay (HOSPITAL_BASED_OUTPATIENT_CLINIC_OR_DEPARTMENT_OTHER): Payer: BLUE CROSS/BLUE SHIELD | Admitting: Oncology

## 2015-02-28 ENCOUNTER — Inpatient Hospital Stay: Payer: BLUE CROSS/BLUE SHIELD | Attending: Oncology

## 2015-02-28 VITALS — BP 103/71 | HR 93 | Temp 96.0°F | Resp 16 | Wt 224.2 lb

## 2015-02-28 DIAGNOSIS — T451X5S Adverse effect of antineoplastic and immunosuppressive drugs, sequela: Secondary | ICD-10-CM

## 2015-02-28 DIAGNOSIS — Z79899 Other long term (current) drug therapy: Secondary | ICD-10-CM | POA: Insufficient documentation

## 2015-02-28 DIAGNOSIS — G620 Drug-induced polyneuropathy: Secondary | ICD-10-CM | POA: Diagnosis not present

## 2015-02-28 DIAGNOSIS — C61 Malignant neoplasm of prostate: Secondary | ICD-10-CM | POA: Diagnosis not present

## 2015-02-28 DIAGNOSIS — C7951 Secondary malignant neoplasm of bone: Secondary | ICD-10-CM

## 2015-02-28 DIAGNOSIS — R5381 Other malaise: Secondary | ICD-10-CM

## 2015-02-28 DIAGNOSIS — R5383 Other fatigue: Secondary | ICD-10-CM | POA: Diagnosis not present

## 2015-02-28 DIAGNOSIS — E1165 Type 2 diabetes mellitus with hyperglycemia: Secondary | ICD-10-CM | POA: Diagnosis not present

## 2015-02-28 DIAGNOSIS — Z7982 Long term (current) use of aspirin: Secondary | ICD-10-CM | POA: Insufficient documentation

## 2015-02-28 DIAGNOSIS — Z5111 Encounter for antineoplastic chemotherapy: Secondary | ICD-10-CM | POA: Insufficient documentation

## 2015-02-28 DIAGNOSIS — R0602 Shortness of breath: Secondary | ICD-10-CM | POA: Diagnosis not present

## 2015-02-28 DIAGNOSIS — Z87891 Personal history of nicotine dependence: Secondary | ICD-10-CM | POA: Diagnosis not present

## 2015-02-28 DIAGNOSIS — C801 Malignant (primary) neoplasm, unspecified: Secondary | ICD-10-CM

## 2015-02-28 LAB — CBC WITH DIFFERENTIAL/PLATELET
BASOS ABS: 0 10*3/uL (ref 0–0.1)
BASOS PCT: 0 %
EOS PCT: 0 %
Eosinophils Absolute: 0 10*3/uL (ref 0–0.7)
HCT: 35.7 % — ABNORMAL LOW (ref 40.0–52.0)
Hemoglobin: 11.9 g/dL — ABNORMAL LOW (ref 13.0–18.0)
Lymphocytes Relative: 14 %
Lymphs Abs: 0.8 10*3/uL — ABNORMAL LOW (ref 1.0–3.6)
MCH: 28.8 pg (ref 26.0–34.0)
MCHC: 33.3 g/dL (ref 32.0–36.0)
MCV: 86.4 fL (ref 80.0–100.0)
MONO ABS: 0.2 10*3/uL (ref 0.2–1.0)
Monocytes Relative: 3 %
Neutro Abs: 5 10*3/uL (ref 1.4–6.5)
Neutrophils Relative %: 83 %
PLATELETS: 276 10*3/uL (ref 150–440)
RBC: 4.13 MIL/uL — ABNORMAL LOW (ref 4.40–5.90)
RDW: 15.9 % — AB (ref 11.5–14.5)
WBC: 6.1 10*3/uL (ref 3.8–10.6)

## 2015-02-28 LAB — COMPREHENSIVE METABOLIC PANEL
ALBUMIN: 3.9 g/dL (ref 3.5–5.0)
ALK PHOS: 144 U/L — AB (ref 38–126)
ALT: 22 U/L (ref 17–63)
AST: 28 U/L (ref 15–41)
Anion gap: 5 (ref 5–15)
BILIRUBIN TOTAL: 0.3 mg/dL (ref 0.3–1.2)
BUN: 14 mg/dL (ref 6–20)
CALCIUM: 8.2 mg/dL — AB (ref 8.9–10.3)
CO2: 23 mmol/L (ref 22–32)
Chloride: 107 mmol/L (ref 101–111)
Creatinine, Ser: 0.78 mg/dL (ref 0.61–1.24)
GFR calc Af Amer: >60 mL/min (ref >60)
GFR calc non Af Amer: >60 mL/min (ref >60)
GLUCOSE: 195 mg/dL — AB (ref 65–99)
Potassium: 3.9 mmol/L (ref 3.5–5.1)
Sodium: 135 mmol/L (ref 135–145)
TOTAL PROTEIN: 7 g/dL (ref 6.5–8.1)

## 2015-02-28 LAB — URINALYSIS COMPLETE WITH MICROSCOPIC (ARMC ONLY)
BACTERIA UA: NONE SEEN
BILIRUBIN URINE: NEGATIVE
GLUCOSE, UA: 50 mg/dL — AB
Ketones, ur: NEGATIVE mg/dL
LEUKOCYTES UA: NEGATIVE
NITRITE: NEGATIVE
Protein, ur: NEGATIVE mg/dL
SQUAMOUS EPITHELIAL / LPF: NONE SEEN
Specific Gravity, Urine: 1.016 (ref 1.005–1.030)
pH: 5 (ref 5.0–8.0)

## 2015-02-28 MED ORDER — HEPARIN SOD (PORK) LOCK FLUSH 100 UNIT/ML IV SOLN
500.0000 [IU] | Freq: Once | INTRAVENOUS | Status: AC
  Administered 2015-02-28: 500 [IU] via INTRAVENOUS

## 2015-02-28 MED ORDER — FENTANYL 100 MCG/HR TD PT72: 100.0000 ug | MEDICATED_PATCH | TRANSDERMAL | Status: DC

## 2015-02-28 MED ORDER — FENTANYL 50 MCG/HR TD PT72: 50.0000 ug | MEDICATED_PATCH | TRANSDERMAL | Status: DC

## 2015-02-28 MED ORDER — HYDROMORPHONE HCL 2 MG PO TABS: 2.0000 mg | ORAL_TABLET | Freq: Every day | ORAL | Status: DC

## 2015-02-28 MED ORDER — SODIUM CHLORIDE 0.9 % IV SOLN
Freq: Once | INTRAVENOUS | Status: AC
  Administered 2015-02-28: 12:00:00 via INTRAVENOUS
  Filled 2015-02-28: qty 1000

## 2015-02-28 MED ORDER — DOCETAXEL CHEMO INJECTION 160 MG/16ML
37.5000 mg/m2 | Freq: Once | INTRAVENOUS | Status: AC
  Administered 2015-02-28: 80 mg via INTRAVENOUS
  Filled 2015-02-28: qty 8

## 2015-02-28 MED ORDER — DEXAMETHASONE SODIUM PHOSPHATE 100 MG/10ML IJ SOLN
Freq: Once | INTRAMUSCULAR | Status: AC
  Administered 2015-02-28: 12:00:00 via INTRAVENOUS
  Filled 2015-02-28: qty 4

## 2015-02-28 MED ORDER — SODIUM CHLORIDE 0.9 % IJ SOLN
10.0000 mL | Freq: Once | INTRAMUSCULAR | Status: AC
  Administered 2015-02-28: 10 mL via INTRAVENOUS
  Filled 2015-02-28: qty 10

## 2015-02-28 MED ORDER — HEPARIN SOD (PORK) LOCK FLUSH 100 UNIT/ML IV SOLN
INTRAVENOUS | Status: AC
  Filled 2015-02-28: qty 5

## 2015-02-28 NOTE — Progress Notes (Signed)
Patient had 1 episode of hematuria

## 2015-03-01 ENCOUNTER — Telehealth: Payer: Self-pay | Admitting: *Deleted

## 2015-03-01 NOTE — Telephone Encounter (Signed)
Anthony [name] called requesting a call from a nurse, but there is no answer on the number she left, her cell, or the home number

## 2015-03-02 LAB — URINE CULTURE
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
## 2015-03-02 NOTE — Progress Notes (Signed)
[facility]  [phone] [phone]  ID: Enis Gash OB: 09-01-1955  MR#: [id number]  LSL#:[id number]  Patient Care Team: Tracie Harrier, MD as PCP - General (Internal Medicine)  CHIEF COMPLAINT:  Chief Complaint  Patient presents with  . Prostate Cancer    INTERVAL HISTORY: Patient returns to clinic today for consideration of cycle 12 of Taxotere for his metastatic prostate cancer. Patient initiated treatment on June 12, 2014. He continues to have bilateral peripheral neuropathy, but this is unchanged. He does not complain of weakness or fatigue today. He denies any fevers. He has a good appetite. He denies any chest pain, but does have intermittent shortness of breath. He denies any nausea, vomiting, constipation, or diarrhea. He has no urinary complaints. Patient offers no further specific complaints today.   REVIEW OF SYSTEMS:   Review of Systems  Constitutional: Positive for malaise/fatigue. Negative for fever and weight loss.  Respiratory: Positive for shortness of breath.   Cardiovascular: Negative.   Gastrointestinal: Negative.   Musculoskeletal: Negative.   Neurological: Negative.     As per HPI. Otherwise, a complete review of systems is negatve.  PAST MEDICAL HISTORY: Past Medical History  Diagnosis Date  . Prostate cancer   . Anemia   . Heart murmur     PAST SURGICAL HISTORY: Past Surgical History  Procedure Laterality Date  . Hernia repair      FAMILY HISTORY: Reviewed and unchanged. No reported history of malignancy or chronic disease.     ADVANCED DIRECTIVES:    HEALTH MAINTENANCE: Social History  Substance Use Topics  . Smoking status: Former Smoker    Types: Cigarettes    Quit date: 09/25/2013  . Smokeless tobacco: Not on file  . Alcohol Use: No     Colonoscopy:  PAP:  Bone density:  Lipid panel:  Allergies  Allergen Reactions  . No Known Allergies     Current Outpatient  Prescriptions  Medication Sig Dispense Refill  . albuterol (PROVENTIL HFA;VENTOLIN HFA) 108 (90 BASE) MCG/ACT inhaler Inhale 90 mcg into the lungs as needed. 2 puffs by mouth every 6 hours as needed for wheezing    . aspirin 81 MG tablet Take 81 mg by mouth daily. In the morning    . calcium gluconate 500 MG tablet Take 1 tablet (500 mg total) by mouth 3 (three) times daily. 90 tablet 2  . dexamethasone (DECADRON) 4 MG tablet At [facility] take the night before chemotherapy treatments 30 tablet 0  . diphenhydrAMINE (BENADRYL) 25 MG tablet Take 25 mg by mouth every 8 (eight) hours as needed for itching.    . docusate sodium (COLACE) 100 MG capsule Take 100 mg by mouth 3 (three) times daily. As needed for constipation    . ENSURE (ENSURE) Take 1 Can by mouth 3 (three) times daily between meals.    . fentaNYL (DURAGESIC - DOSED MCG/HR) 100 MCG/HR Place 1 patch (100 mcg total) onto the skin every 3 (three) days. 10 patch 0  . fentaNYL (DURAGESIC - DOSED MCG/HR) 50 MCG/HR Place 1 patch (50 mcg total) onto the skin every 3 (three) days. 10 patch 0  . gabapentin (NEURONTIN) 300 MG capsule Take 1 capsule (300 mg total) by mouth 3 (three) times daily. At bedtme 90 capsule 1  . HYDROmorphone (DILAUDID) 2 MG tablet Take 1 tablet (2 mg total) by mouth daily. 30 tablet 0  . lidocaine-prilocaine (EMLA) cream     . loratadine (CLARITIN) 10 MG tablet Take 10 mg by  mouth daily. 1 tablet orally once a day (only on the day before chemotherapy, the day of chemotherapy and the day after chemotherapy.)    . metoprolol (LOPRESSOR) 50 MG tablet Take 1 tablet (50 mg total) by mouth 2 (two) times daily. 60 tablet 2  . polyethylene glycol (MIRALAX / GLYCOLAX) packet Take 17 g by mouth 2 (two) times daily.    . ranitidine (ZANTAC) 150 MG tablet Take 1 tablet (150 mg total) by mouth 2 (two) times daily. 60 tablet 2  . venlafaxine (EFFEXOR) 37.5 MG tablet Take 1 tablet (37.5 mg total) by mouth at bedtime. 30 tablet 2  .  Vitamin D, Ergocalciferol, (DRISDOL) 50000 UNITS CAPS capsule Take 50,000 Units by mouth every 7 (seven) days.     No current facility-administered medications for this visit.   Facility-Administered Medications Ordered in Other Visits  Medication Dose Route Frequency Provider Last Rate Last Dose  . sodium chloride 0.9 % injection 10 mL  10 mL Intravenous PRN Lloyd Huger, MD   10 mL at 01/10/15 1508    OBJECTIVE: Filed Vitals:   02/28/15 1046  BP: 103/71  Pulse: 93  Temp: 96 F (35.6 C)  Resp: 16     Body mass index is 32.17 kg/(m^2).    ECOG FS:1 - Symptomatic but completely ambulatory  General: Well-developed, well-nourished, no acute distress. Eyes: anicteric sclera. Lungs: Clear to auscultation bilaterally. Heart: Regular rate and rhythm. No rubs, murmurs, or gallops. Abdomen: Soft, nontender, nondistended. No organomegaly noted, normoactive bowel sounds. Musculoskeletal: No edema, cyanosis, or clubbing. Neuro: Alert, answering all questions appropriately. Cranial nerves grossly intact. Skin: No rashes or petechiae noted. Psych: Normal affect.   LAB RESULTS:  Lab Results  Component Value Date   NA 135 02/28/2015   K 3.9 02/28/2015   CL 107 02/28/2015   CO2 23 02/28/2015   GLUCOSE 195* 02/28/2015   BUN 14 02/28/2015   CREATININE 0.78 02/28/2015   CALCIUM 8.2* 02/28/2015   PROT 7.0 02/28/2015   ALBUMIN 3.9 02/28/2015   AST 28 02/28/2015   ALT 22 02/28/2015   ALKPHOS 144* 02/28/2015   BILITOT 0.3 02/28/2015   GFRNONAA >60 02/28/2015   GFRAA >60 02/28/2015    Lab Results  Component Value Date   WBC 6.1 02/28/2015   NEUTROABS 5.0 02/28/2015   HGB 11.9* 02/28/2015   HCT 35.7* 02/28/2015   MCV 86.4 02/28/2015   PLT 276 02/28/2015     STUDIES: No results found.  ASSESSMENT: Stage IV prostate cancer with diffuse retroperitoneal lymphadenopathy as well as bony metastasis.  PLAN:    1. Prostate cancer: CT scan results after cycle 6 reviewed  independently with improved disease burden. Patient's PSA continues to be less than 0.1 despite evidence of metastatic disease.  Proceed with cycle 12 of dose reduced Taxotere. Patient also receives 80mg  degarelix and 120 mg Xgeva on odd numbered cycles. Return to clinic in 6 weeks with repeat imaging and further evaluation. Will also continue Degarelix and Xgeva at that time.  Patient recently completed a course of palliative XRT. If progression on Taxotere, can consider cabazitaxel in the future.  2. Pain: Continue current narcotic regimen. 3. Peripheral neuropathy: Dose reduced Taxotere as above, monitor. 4. Hyperglycemia: Continue current diabetic medications as prescribed.  Patient expressed understanding and was in agreement with this plan. He also understands that He can call clinic at any time with any questions, concerns, or complaints.     Lloyd Huger, MD   03/02/2015 9:17  AM     

## 2015-03-20 ENCOUNTER — Telehealth: Payer: Self-pay | Admitting: *Deleted

## 2015-03-20 NOTE — Telephone Encounter (Signed)
Wanted to make MD aware that he has reduced his fentanyl patch from 117mcg to 13mcg 4 days ago and his pain is well controlled at this time

## 2015-03-26 ENCOUNTER — Other Ambulatory Visit: Payer: Self-pay | Admitting: Oncology

## 2015-04-06 ENCOUNTER — Other Ambulatory Visit: Payer: Self-pay | Admitting: Oncology

## 2015-04-06 ENCOUNTER — Encounter: Admission: RE | Admit: 2015-04-06 | Payer: BLUE CROSS/BLUE SHIELD | Source: Ambulatory Visit

## 2015-04-06 NOTE — Telephone Encounter (Signed)
Thank you, will forward to MD.

## 2015-04-06 NOTE — Telephone Encounter (Signed)
He missed his bone scan today.

## 2015-04-09 ENCOUNTER — Inpatient Hospital Stay: Payer: BLUE CROSS/BLUE SHIELD

## 2015-04-09 ENCOUNTER — Inpatient Hospital Stay: Payer: BLUE CROSS/BLUE SHIELD | Attending: Oncology

## 2015-04-09 ENCOUNTER — Other Ambulatory Visit: Payer: Self-pay | Admitting: *Deleted

## 2015-04-09 ENCOUNTER — Ambulatory Visit: Payer: BLUE CROSS/BLUE SHIELD

## 2015-04-09 DIAGNOSIS — E1165 Type 2 diabetes mellitus with hyperglycemia: Secondary | ICD-10-CM | POA: Insufficient documentation

## 2015-04-09 DIAGNOSIS — R590 Localized enlarged lymph nodes: Secondary | ICD-10-CM | POA: Diagnosis not present

## 2015-04-09 DIAGNOSIS — Z79818 Long term (current) use of other agents affecting estrogen receptors and estrogen levels: Secondary | ICD-10-CM | POA: Insufficient documentation

## 2015-04-09 DIAGNOSIS — C61 Malignant neoplasm of prostate: Secondary | ICD-10-CM | POA: Diagnosis present

## 2015-04-09 DIAGNOSIS — T451X5S Adverse effect of antineoplastic and immunosuppressive drugs, sequela: Secondary | ICD-10-CM | POA: Diagnosis not present

## 2015-04-09 DIAGNOSIS — G620 Drug-induced polyneuropathy: Secondary | ICD-10-CM | POA: Insufficient documentation

## 2015-04-09 DIAGNOSIS — Z7982 Long term (current) use of aspirin: Secondary | ICD-10-CM | POA: Diagnosis not present

## 2015-04-09 DIAGNOSIS — Z87891 Personal history of nicotine dependence: Secondary | ICD-10-CM | POA: Insufficient documentation

## 2015-04-09 DIAGNOSIS — C7951 Secondary malignant neoplasm of bone: Secondary | ICD-10-CM | POA: Diagnosis not present

## 2015-04-09 DIAGNOSIS — R0602 Shortness of breath: Secondary | ICD-10-CM | POA: Insufficient documentation

## 2015-04-09 DIAGNOSIS — Z79899 Other long term (current) drug therapy: Secondary | ICD-10-CM | POA: Insufficient documentation

## 2015-04-09 LAB — CBC WITH DIFFERENTIAL/PLATELET
BASOS PCT: 1 %
Basophils Absolute: 0 10*3/uL (ref 0–0.1)
Eosinophils Absolute: 0.3 10*3/uL (ref 0–0.7)
Eosinophils Relative: 7 %
HEMATOCRIT: 34.3 % — AB (ref 40.0–52.0)
HEMOGLOBIN: 11.1 g/dL — AB (ref 13.0–18.0)
LYMPHS ABS: 1.5 10*3/uL (ref 1.0–3.6)
Lymphocytes Relative: 33 %
MCH: 28.3 pg (ref 26.0–34.0)
MCHC: 32.4 g/dL (ref 32.0–36.0)
MCV: 87.1 fL (ref 80.0–100.0)
MONO ABS: 0.6 10*3/uL (ref 0.2–1.0)
MONOS PCT: 13 %
NEUTROS ABS: 2.1 10*3/uL (ref 1.4–6.5)
NEUTROS PCT: 46 %
Platelets: 260 10*3/uL (ref 150–440)
RBC: 3.93 MIL/uL — ABNORMAL LOW (ref 4.40–5.90)
RDW: 16.9 % — AB (ref 11.5–14.5)
WBC: 4.6 10*3/uL (ref 3.8–10.6)

## 2015-04-09 LAB — COMPREHENSIVE METABOLIC PANEL
ALBUMIN: 3.7 g/dL (ref 3.5–5.0)
ALK PHOS: 95 U/L (ref 38–126)
ALT: 26 U/L (ref 17–63)
AST: 30 U/L (ref 15–41)
Anion gap: 2 — ABNORMAL LOW (ref 5–15)
BUN: 9 mg/dL (ref 6–20)
CALCIUM: 7.5 mg/dL — AB (ref 8.9–10.3)
CHLORIDE: 108 mmol/L (ref 101–111)
CO2: 26 mmol/L (ref 22–32)
CREATININE: 0.91 mg/dL (ref 0.61–1.24)
GFR calc Af Amer: >60 mL/min (ref >60)
GFR calc non Af Amer: >60 mL/min (ref >60)
GLUCOSE: 97 mg/dL (ref 65–99)
Potassium: 4.4 mmol/L (ref 3.5–5.1)
SODIUM: 136 mmol/L (ref 135–145)
Total Bilirubin: 0.5 mg/dL (ref 0.3–1.2)
Total Protein: 6.3 g/dL — ABNORMAL LOW (ref 6.5–8.1)

## 2015-04-09 MED ORDER — SODIUM CHLORIDE 0.9 % IJ SOLN
10.0000 mL | INTRAMUSCULAR | Status: DC | PRN
  Administered 2015-04-09: 10 mL via INTRAVENOUS
  Filled 2015-04-09: qty 10

## 2015-04-09 MED ORDER — HEPARIN SOD (PORK) LOCK FLUSH 100 UNIT/ML IV SOLN
500.0000 [IU] | Freq: Once | INTRAVENOUS | Status: AC
  Administered 2015-04-09: 500 [IU] via INTRAVENOUS
  Filled 2015-04-09: qty 5

## 2015-04-09 MED ORDER — CALCIUM GLUCONATE 500 MG PO TABS: 1.0000 | ORAL_TABLET | Freq: Three times a day (TID) | ORAL | Status: AC

## 2015-04-10 ENCOUNTER — Ambulatory Visit
Admission: RE | Admit: 2015-04-10 | Discharge: 2015-04-10 | Disposition: A | Discharge status: Home / Self Care | Payer: BLUE CROSS/BLUE SHIELD | Source: Ambulatory Visit | Attending: Oncology | Admitting: Oncology

## 2015-04-10 DIAGNOSIS — C7951 Secondary malignant neoplasm of bone: Secondary | ICD-10-CM | POA: Insufficient documentation

## 2015-04-10 DIAGNOSIS — K429 Umbilical hernia without obstruction or gangrene: Secondary | ICD-10-CM | POA: Insufficient documentation

## 2015-04-10 DIAGNOSIS — N281 Cyst of kidney, acquired: Secondary | ICD-10-CM | POA: Insufficient documentation

## 2015-04-10 DIAGNOSIS — M8448XA Pathological fracture, other site, initial encounter for fracture: Secondary | ICD-10-CM | POA: Diagnosis not present

## 2015-04-10 DIAGNOSIS — Z8546 Personal history of malignant neoplasm of prostate: Secondary | ICD-10-CM | POA: Diagnosis not present

## 2015-04-10 DIAGNOSIS — C61 Malignant neoplasm of prostate: Secondary | ICD-10-CM

## 2015-04-10 MED ORDER — IOHEXOL 300 MG/ML  SOLN
100.0000 mL | Freq: Once | INTRAMUSCULAR | Status: AC | PRN
  Administered 2015-04-10: 100 mL via INTRAVENOUS

## 2015-04-11 ENCOUNTER — Inpatient Hospital Stay: Payer: BLUE CROSS/BLUE SHIELD

## 2015-04-11 ENCOUNTER — Telehealth: Payer: Self-pay

## 2015-04-11 ENCOUNTER — Inpatient Hospital Stay (HOSPITAL_BASED_OUTPATIENT_CLINIC_OR_DEPARTMENT_OTHER): Payer: BLUE CROSS/BLUE SHIELD | Admitting: Oncology

## 2015-04-11 ENCOUNTER — Other Ambulatory Visit: Payer: BLUE CROSS/BLUE SHIELD

## 2015-04-11 VITALS — BP 108/71 | HR 84 | Temp 98.8°F | Resp 18 | Wt 231.8 lb

## 2015-04-11 DIAGNOSIS — C61 Malignant neoplasm of prostate: Secondary | ICD-10-CM | POA: Diagnosis not present

## 2015-04-11 DIAGNOSIS — C7951 Secondary malignant neoplasm of bone: Secondary | ICD-10-CM | POA: Diagnosis not present

## 2015-04-11 DIAGNOSIS — Z79818 Long term (current) use of other agents affecting estrogen receptors and estrogen levels: Secondary | ICD-10-CM

## 2015-04-11 DIAGNOSIS — R0602 Shortness of breath: Secondary | ICD-10-CM

## 2015-04-11 DIAGNOSIS — T451X5S Adverse effect of antineoplastic and immunosuppressive drugs, sequela: Secondary | ICD-10-CM

## 2015-04-11 DIAGNOSIS — G620 Drug-induced polyneuropathy: Secondary | ICD-10-CM

## 2015-04-11 DIAGNOSIS — R590 Localized enlarged lymph nodes: Secondary | ICD-10-CM

## 2015-04-11 DIAGNOSIS — Z87891 Personal history of nicotine dependence: Secondary | ICD-10-CM

## 2015-04-11 DIAGNOSIS — Z7982 Long term (current) use of aspirin: Secondary | ICD-10-CM

## 2015-04-11 DIAGNOSIS — Z79899 Other long term (current) drug therapy: Secondary | ICD-10-CM

## 2015-04-11 DIAGNOSIS — E1165 Type 2 diabetes mellitus with hyperglycemia: Secondary | ICD-10-CM

## 2015-04-11 MED ORDER — DEGARELIX ACETATE 80 MG ~~LOC~~ SOLR
80.0000 mg | Freq: Once | SUBCUTANEOUS | Status: AC
Start: 2015-04-11 — End: 2015-04-11
  Administered 2015-04-11: 80 mg via SUBCUTANEOUS
  Filled 2015-04-11: qty 4

## 2015-04-11 MED ORDER — DENOSUMAB 120 MG/1.7ML ~~LOC~~ SOLN
120.0000 mg | Freq: Once | SUBCUTANEOUS | Status: AC
  Administered 2015-04-11: 120 mg via SUBCUTANEOUS
  Filled 2015-04-11: qty 1.7

## 2015-04-11 NOTE — Telephone Encounter (Signed)
Calcium =7.5.  Patient ordered xgeva.  Contacted MD to question order with low calcium.  MD verified wanted patient to get xgeva today.

## 2015-04-11 NOTE — Progress Notes (Signed)
Spoke with Dr. Grayland Ormond to verify the administration of Delton See today and he said yes to give today. LJ

## 2015-04-12 ENCOUNTER — Telehealth: Payer: Self-pay | Admitting: *Deleted

## 2015-04-12 DIAGNOSIS — C61 Malignant neoplasm of prostate: Secondary | ICD-10-CM

## 2015-04-12 DIAGNOSIS — C772 Secondary and unspecified malignant neoplasm of intra-abdominal lymph nodes: Principal | ICD-10-CM

## 2015-04-12 MED ORDER — RANITIDINE HCL 150 MG PO TABS: 150.0000 mg | ORAL_TABLET | Freq: Two times a day (BID) | ORAL | Status: AC

## 2015-04-12 NOTE — Telephone Encounter (Signed)
Escribed

## 2015-04-16 ENCOUNTER — Emergency Department
Admission: EM | Admit: 2015-04-16 | Discharge: 2015-04-17 | Disposition: A | Discharge status: Home / Self Care | Payer: BLUE CROSS/BLUE SHIELD | Attending: Emergency Medicine | Admitting: Emergency Medicine

## 2015-04-16 ENCOUNTER — Emergency Department: Payer: BLUE CROSS/BLUE SHIELD

## 2015-04-16 ENCOUNTER — Encounter: Payer: Self-pay | Admitting: *Deleted

## 2015-04-16 DIAGNOSIS — Z87891 Personal history of nicotine dependence: Secondary | ICD-10-CM | POA: Diagnosis not present

## 2015-04-16 DIAGNOSIS — R002 Palpitations: Secondary | ICD-10-CM | POA: Diagnosis not present

## 2015-04-16 DIAGNOSIS — R42 Dizziness and giddiness: Secondary | ICD-10-CM | POA: Diagnosis not present

## 2015-04-16 DIAGNOSIS — R0602 Shortness of breath: Secondary | ICD-10-CM | POA: Diagnosis present

## 2015-04-16 DIAGNOSIS — Z79891 Long term (current) use of opiate analgesic: Secondary | ICD-10-CM | POA: Diagnosis not present

## 2015-04-16 DIAGNOSIS — R05 Cough: Secondary | ICD-10-CM | POA: Diagnosis not present

## 2015-04-16 DIAGNOSIS — Z79899 Other long term (current) drug therapy: Secondary | ICD-10-CM | POA: Insufficient documentation

## 2015-04-16 DIAGNOSIS — R5383 Other fatigue: Secondary | ICD-10-CM | POA: Insufficient documentation

## 2015-04-16 DIAGNOSIS — Z7982 Long term (current) use of aspirin: Secondary | ICD-10-CM | POA: Insufficient documentation

## 2015-04-16 LAB — BASIC METABOLIC PANEL
Anion gap: 5 (ref 5–15)
BUN: 10 mg/dL (ref 6–20)
CALCIUM: 9.1 mg/dL (ref 8.9–10.3)
CO2: 24 mmol/L (ref 22–32)
Chloride: 109 mmol/L (ref 101–111)
Creatinine, Ser: 0.88 mg/dL (ref 0.61–1.24)
GFR calc Af Amer: >60 mL/min (ref >60)
Glucose, Bld: 129 mg/dL — ABNORMAL HIGH (ref 65–99)
POTASSIUM: 4.2 mmol/L (ref 3.5–5.1)
Sodium: 138 mmol/L (ref 135–145)

## 2015-04-16 LAB — CBC
HEMATOCRIT: 35.5 % — AB (ref 40.0–52.0)
Hemoglobin: 11.5 g/dL — ABNORMAL LOW (ref 13.0–18.0)
MCH: 28.4 pg (ref 26.0–34.0)
MCHC: 32.5 g/dL (ref 32.0–36.0)
MCV: 87.6 fL (ref 80.0–100.0)
Platelets: 277 10*3/uL (ref 150–440)
RBC: 4.05 MIL/uL — ABNORMAL LOW (ref 4.40–5.90)
RDW: 16.6 % — AB (ref 11.5–14.5)
WBC: 5.2 10*3/uL (ref 3.8–10.6)

## 2015-04-16 LAB — TROPONIN I: Troponin I: <0.03 ng/mL (ref <0.031)

## 2015-04-16 NOTE — ED Notes (Signed)
Pt says that he has felt weak, short of breath, and feel like his heart is beating fast.

## 2015-04-17 ENCOUNTER — Emergency Department: Payer: BLUE CROSS/BLUE SHIELD

## 2015-04-17 LAB — URINALYSIS COMPLETE WITH MICROSCOPIC (ARMC ONLY)
BILIRUBIN URINE: NEGATIVE
Bacteria, UA: NONE SEEN
Glucose, UA: NEGATIVE mg/dL
HGB URINE DIPSTICK: NEGATIVE
KETONES UR: NEGATIVE mg/dL
LEUKOCYTES UA: NEGATIVE
Nitrite: NEGATIVE
PH: 6 (ref 5.0–8.0)
PROTEIN: NEGATIVE mg/dL
RBC / HPF: NONE SEEN RBC/hpf (ref 0–5)
SPECIFIC GRAVITY, URINE: 1.005 (ref 1.005–1.030)
Squamous Epithelial / LPF: NONE SEEN

## 2015-04-17 MED ORDER — LIDOCAINE-PRILOCAINE 2.5-2.5 % EX CREA
TOPICAL_CREAM | CUTANEOUS | Status: AC
  Filled 2015-04-17: qty 5

## 2015-04-17 MED ORDER — LIDOCAINE-PRILOCAINE 2.5-2.5 % EX CREA
TOPICAL_CREAM | Freq: Once | CUTANEOUS | Status: AC
  Administered 2015-04-17: 1 via TOPICAL

## 2015-04-17 MED ORDER — SODIUM CHLORIDE 0.9 % IV BOLUS (SEPSIS)
1000.0000 mL | Freq: Once | INTRAVENOUS | Status: AC
  Administered 2015-04-17: 1000 mL via INTRAVENOUS

## 2015-04-17 NOTE — ED Notes (Signed)
Patient with no complaints at this time. Respirations even and unlabored. Skin warm/dry. Discharge instructions reviewed with patient at this time. Patient given opportunity to voice concerns/ask questions. IV removed per policy and band-aid applied to site. Patient discharged at this time and left Emergency Department, via wheelchair.   

## 2015-04-17 NOTE — ED Provider Notes (Signed)
[facility] Emergency Department Provider Note  ____________________________________________  Time seen: Approximately 2323 PM  I have reviewed the triage vital signs and the nursing notes.   HISTORY  Chief Complaint Palpitations and Shortness of Breath    HPI Anthony [name] is a 59 y.o. male with a history of prostate cancer who comes in today with multiple symptoms. The patient reports that he is feeling tired with no energy. The patient also reports that his head feels woozy and he keeps yawning. He reports that he has been feeling this way for the last 2 days but seemed worse today. The patient reports that when he walks he feels as though he is dragging and his body feels very heavy. Initially he reports that he was sleeping fairly okay but in the last couple of days he said he has not been sleeping too well. The patient reports that he completed chemotherapy on September 7 and he received a CT scan last week to determine his status. The patient also reports that in the last couple of days he has not had much of an appetite so he has not been eating well. The patient reports that he has a lingering cough that has been there for some time and some intermittent shortness of breath. The patient denies any chest pain and also reports that he does not have on his fentanyl patch as his pain is minimal. The patient has had no fever but has had some mild congestion.The patient reports that he did not contact Dr. Gary Fleet office today when he was feeling unwell but he is here for evaluation.   Past Medical History  Diagnosis Date  . Prostate cancer ([place])   . Anemia   . Heart murmur     Patient Active Problem List   Diagnosis Date Noted  . Prostate cancer ([place]) 11/15/2014    Past Surgical History  Procedure Laterality Date  . Hernia repair      Current Outpatient Rx  Name  Route  Sig  Dispense  Refill  . albuterol (PROVENTIL HFA;VENTOLIN HFA) 108 (90 BASE)  MCG/ACT inhaler   Inhalation   Inhale 90 mcg into the lungs as needed. 2 puffs by mouth every 6 hours as needed for wheezing         . aspirin 81 MG tablet   Oral   Take 81 mg by mouth daily. In the morning         . calcium gluconate 500 MG tablet   Oral   Take 1 tablet (500 mg total) by mouth 3 (three) times daily.   90 tablet   2   . dexamethasone (DECADRON) 4 MG tablet      At [facility] take the night before chemotherapy treatments Patient not taking: Reported on 04/11/2015   30 tablet   0   . diphenhydrAMINE (BENADRYL) 25 MG tablet   Oral   Take 25 mg by mouth every 8 (eight) hours as needed for itching.         . docusate sodium (COLACE) 100 MG capsule   Oral   Take 100 mg by mouth 3 (three) times daily. As needed for constipation         . ENSURE (ENSURE)   Oral   Take 1 Can by mouth 3 (three) times daily between meals.         . fentaNYL (DURAGESIC - DOSED MCG/HR) 100 MCG/HR   Transdermal   Place 1 patch (100 mcg total) onto the  skin every 3 (three) days.   10 patch   0   . fentaNYL (DURAGESIC - DOSED MCG/HR) 50 MCG/HR   Transdermal   Place 1 patch (50 mcg total) onto the skin every 3 (three) days. Patient not taking: Reported on 04/11/2015   10 patch   0   . gabapentin (NEURONTIN) 300 MG capsule   Oral   Take 1 capsule (300 mg total) by mouth 3 (three) times daily. At bedtme   90 capsule   1   . HYDROmorphone (DILAUDID) 2 MG tablet      TAKE ONE TABLET BY MOUTH EVERY DAY   30 tablet   0   . lidocaine-prilocaine (EMLA) cream               . loratadine (CLARITIN) 10 MG tablet   Oral   Take 10 mg by mouth daily. 1 tablet orally once a day (only on the day before chemotherapy, the day of chemotherapy and the day after chemotherapy.)         . metoprolol (LOPRESSOR) 50 MG tablet   Oral   Take 1 tablet (50 mg total) by mouth 2 (two) times daily.   60 tablet   2   . metoprolol tartrate (LOPRESSOR) 25 MG tablet   Oral   Take  25 mg by mouth.      2   . polyethylene glycol (MIRALAX / GLYCOLAX) packet   Oral   Take 17 g by mouth 2 (two) times daily.         . ranitidine (ZANTAC) 150 MG tablet   Oral   Take 1 tablet (150 mg total) by mouth 2 (two) times daily.   60 tablet   2   . venlafaxine (EFFEXOR) 37.5 MG tablet   Oral   Take 1 tablet (37.5 mg total) by mouth at bedtime.   30 tablet   2     BYRD FUND   . Vitamin D, Ergocalciferol, (DRISDOL) 50000 UNITS CAPS capsule   Oral   Take 50,000 Units by mouth every 7 (seven) days.         . Vitamin D, Ergocalciferol, (DRISDOL) 50000 UNITS CAPS capsule   Oral   Take 50,000 Units by mouth.           Allergies Review of patient's allergies indicates no known allergies.  No family history on file.  Social History Social History  Substance Use Topics  . Smoking status: Former Smoker    Types: Cigarettes    Quit date: 09/25/2013  . Smokeless tobacco: None  . Alcohol Use: No    Review of Systems Constitutional: No fever/chills Eyes: No visual changes. ENT: No sore throat. Cardiovascular: Denies chest pain. Respiratory: Occasional shortness of breath. Gastrointestinal: No abdominal pain.  No nausea, no vomiting.  No diarrhea.  No constipation. Genitourinary: Negative for dysuria. Musculoskeletal: Negative for back pain. Skin: Negative for rash. Neurological: Dizziness  10-point ROS otherwise negative.  ____________________________________________   PHYSICAL EXAM:  VITAL SIGNS: ED Triage Vitals  Enc Vitals Group     BP 04/16/15 2236 117/78 mmHg     Pulse Rate 04/16/15 2236 97     Resp 04/16/15 2236 18     Temp 04/16/15 2236 99 F (37.2 C)     Temp Source 04/16/15 2236 Oral     SpO2 04/16/15 2236 100 %     Weight 04/16/15 2236 230 lb (104.327 kg)     Height 04/16/15 2236 5\' 10"  (1.778 m)  Head Cir --      Peak Flow --      Pain Score 04/16/15 2236 3     Pain Loc --      Pain Edu? --      Excl. in [place]? --      Constitutional: Alert and oriented. Well appearing and in no acute distress. Eyes: Conjunctivae are normal. PERRL. EOMI. Head: Atraumatic. Nose: No congestion/rhinnorhea. Mouth/Throat: Mucous membranes are moist.  Oropharynx non-erythematous. Cardiovascular: Normal rate, regular rhythm. Grossly normal heart sounds.  Good peripheral circulation. Respiratory: Normal respiratory effort.  No retractions. Lungs CTAB. Gastrointestinal: Soft and nontender. No distention. Positive bowel sounds Musculoskeletal: No lower extremity tenderness nor edema.   Neurologic:  Normal speech and language. No gross focal neurologic deficits are appreciated.  Skin:  Skin is warm, dry and intact. Psychiatric: Mood and affect are normal.   ____________________________________________   LABS (all labs ordered are listed, but only abnormal results are displayed)  Labs Reviewed  BASIC METABOLIC PANEL - Abnormal; Notable for the following:    Glucose, Bld 129 (*)    All other components within normal limits  CBC - Abnormal; Notable for the following:    RBC 4.05 (*)    Hemoglobin 11.5 (*)    HCT 35.5 (*)    RDW 16.6 (*)    All other components within normal limits  URINALYSIS COMPLETEWITH MICROSCOPIC ([facility] ONLY) - Abnormal; Notable for the following:    Color, Urine STRAW (*)    APPearance CLEAR (*)    All other components within normal limits  TROPONIN I   ____________________________________________  EKG  ED ECG REPORT I, Loney Hering, the attending physician, personally viewed and interpreted this ECG.   Date: 04/17/2015  EKG Time: 2241  Rate: 99  Rhythm: normal sinus rhythm  Axis: normal  Intervals:none  ST&T Change: flipped t waves V3,V4,V5,V6  ____________________________________________  RADIOLOGY  CT head: No acute intracranial pathology seen on CT, mild small vessel ischemic microangiopathy Chest x-ray: No radiographic evidence of acute cardiopulmonary disease, diffuse  osseous metastasis redemonstrated ____________________________________________   PROCEDURES  Procedure(s) performed: None  Critical Care performed: No  ____________________________________________   INITIAL IMPRESSION / ASSESSMENT AND PLAN / ED COURSE  Pertinent labs & imaging results that were available during my care of the patient were reviewed by me and considered in my medical decision making (see chart for details).  This is a 59 year old male who comes in today feeling tired and dizzy. The patient reports that he has not been sleeping well as he has been watching laxative television and he has also not been eating well. The patient's blood work is unremarkable. We did attempt some orthostatics and while the patient did not have any hypotension or significant tachycardia when he went from laying to sitting he did report that he felt dizzy. I will give the patient a liter of normal saline as well as some oral hydration and reassess the patient. I feel that the patient's symptoms may be due to lack of sleep as well as dehydration but I will reassess him once he received some fluid.  The patient did receive some IV fluids and reports that he is feeling improved. At this time I'm unsure what causing the patient's symptoms aside from lack of sleep and poor by mouth intake. I did discuss with the patient that he should follow-up with his primary care physician or his cancer physician for further evaluation. The patient denies any chest pain at this  time and the entire time. ____________________________________________   FINAL CLINICAL IMPRESSION(S) / ED DIAGNOSES  Final diagnoses:  Dizziness  Other fatigue      Loney Hering, MD 04/17/15 [phone]

## 2015-04-17 NOTE — Discharge Instructions (Signed)
Fatigue Fatigue is feeling tired all of the time, a lack of energy, or a lack of motivation. Occasional or mild fatigue is often a normal response to activity or life in general. However, long-lasting (chronic) or extreme fatigue may indicate an underlying medical condition. HOME CARE INSTRUCTIONS  Watch your fatigue for any changes. The following actions may help to lessen any discomfort you are feeling:  Talk to your health care provider about how much sleep you need each night. Try to get the required amount every night.  Take medicines only as directed by your health care provider.  Eat a healthy and nutritious diet. Ask your health care provider if you need help changing your diet.  Drink enough fluid to keep your urine clear or pale yellow.  Practice ways of relaxing, such as yoga, meditation, massage therapy, or acupuncture.  Exercise regularly.   Change situations that cause you stress. Try to keep your work and personal routine reasonable.  Do not abuse illegal drugs.  Limit alcohol intake to no more than 1 drink per day for nonpregnant women and 2 drinks per day for men. One drink equals 12 ounces of beer, 5 ounces of wine, or 1 ounces of hard liquor.  Take a multivitamin, if directed by your health care provider. SEEK MEDICAL CARE IF:   Your fatigue does not get better.  You have a fever.   You have unintentional weight loss or gain.  You have headaches.   You have difficulty:   Falling asleep.  Sleeping throughout the night.  You feel angry, guilty, anxious, or sad.   You are unable to have a bowel movement (constipation).   You skin is dry.   Your legs or another part of your body is swollen.  SEEK IMMEDIATE MEDICAL CARE IF:   You feel confused.   Your vision is blurry.  You feel faint or pass out.   You have a severe headache.   You have severe abdominal, pelvic, or back pain.   You have chest pain, shortness of breath, or an  irregular or fast heartbeat.   You are unable to urinate or you urinate less than normal.   You develop abnormal bleeding, such as bleeding from the rectum, vagina, nose, lungs, or nipples.  You vomit blood.   You have thoughts about harming yourself or committing suicide.   You are worried that you might harm someone else.    This information is not intended to replace advice given to you by your health care provider. Make sure you discuss any questions you have with your health care provider.   Document Released: 04/06/2007 Document Revised: 06/30/2014 Document Reviewed: 10/11/2013 Elsevier Interactive Patient Education 2016 Else[employer]  Dizziness Dizziness is a common problem. It is a feeling of unsteadiness or light-headedness. You may feel like you are about to faint. Dizziness can lead to injury if you stumble or fall. Anyone can become dizzy, but dizziness is more common in older adults. This condition can be caused by a number of things, including medicines, dehydration, or illness. HOME CARE INSTRUCTIONS Taking these steps may help with your condition: Eating and Drinking  Drink enough fluid to keep your urine clear or pale yellow. This helps to keep you from becoming dehydrated. Try to drink more clear fluids, such as water.  Do not drink alcohol.  Limit your caffeine intake if directed by your health care provider.  Limit your salt intake if directed by your health care provider. Activity  Avoid making quick movements.  Rise slowly from chairs and steady yourself until you feel okay.  In the morning, first sit up on the side of the bed. When you feel okay, stand slowly while you hold onto something until you know that your balance is fine.  Move your legs often if you need to stand in one place for a long time. Tighten and relax your muscles in your legs while you are standing.  Do not drive or operate heavy machinery if you feel dizzy.  Avoid bending  down if you feel dizzy. Place items in your home so that they are easy for you to reach without leaning over. Lifestyle  Do not use any tobacco products, including cigarettes, chewing tobacco, or electronic cigarettes. If you need help quitting, ask your health care provider.  Try to reduce your stress level, such as with yoga or meditation. Talk with your health care provider if you need help. General Instructions  Watch your dizziness for any changes.  Take medicines only as directed by your health care provider. Talk with your health care provider if you think that your dizziness is caused by a medicine that you are taking.  Tell a friend or a family member that you are feeling dizzy. If he or she notices any changes in your behavior, have this person call your health care provider.  Keep all follow-up visits as directed by your health care provider. This is important. SEEK MEDICAL CARE IF:  Your dizziness does not go away.  Your dizziness or light-headedness gets worse.  You feel nauseous.  You have reduced hearing.  You have new symptoms.  You are unsteady on your feet or you feel like the room is spinning. SEEK IMMEDIATE MEDICAL CARE IF:  You vomit or have diarrhea and are unable to eat or drink anything.  You have problems talking, walking, swallowing, or using your arms, hands, or legs.  You feel generally weak.  You are not thinking clearly or you have trouble forming sentences. It may take a friend or family member to notice this.  You have chest pain, abdominal pain, shortness of breath, or sweating.  Your vision changes.  You notice any bleeding.  You have a headache.  You have neck pain or a stiff neck.  You have a fever.   This information is not intended to replace advice given to you by your health care provider. Make sure you discuss any questions you have with your health care provider.   Document Released: 12/03/2000 Document Revised: 10/24/2014  Document Reviewed: 06/05/2014 Elsevier Interactive Patient Education [employer].

## 2015-04-18 ENCOUNTER — Telehealth: Payer: Self-pay | Admitting: *Deleted

## 2015-04-18 NOTE — Telephone Encounter (Signed)
Patient called to inform us that he was seen in the ER Sunday and was given IVF. He also reports that he "feels really bad after getting his chemo shot". He would like to know if there are any alternatives to taking chemotherapy injections. Patient also reports he stopped taking his Effexor, states he does not feel like he needs it.

## 2015-04-25 NOTE — Progress Notes (Signed)
Anthony [name]  [phone] [phone]) [phone]  ID: Enis Gash OB: 06-14-1956  MR#: [id number]  AOZ#:[id number]  Patient Care Team: Tracie Harrier, MD as PCP - General (Internal Medicine)  CHIEF COMPLAINT:  Chief Complaint  Patient presents with  . Follow-up    CT scan results    INTERVAL HISTORY: Patient returns to clinic today for continuation of [place]. He was recently arrested after having a fight with his wife, but now is back home in living on his own. He continues to have bilateral peripheral neuropathy, but this is unchanged. He does not complain of weakness or fatigue today. He denies any fevers. He has a good appetite. He denies any chest pain, but does have intermittent shortness of breath. He denies any nausea, vomiting, constipation, or diarrhea. He has no urinary complaints. Patient offers no further specific complaints today.   REVIEW OF SYSTEMS:   Review of Systems  Constitutional: Positive for malaise/fatigue. Negative for fever and weight loss.  Respiratory: Positive for shortness of breath.   Cardiovascular: Negative.   Gastrointestinal: Negative.   Musculoskeletal: Negative.   Neurological: Negative.     As per HPI. Otherwise, a complete review of systems is negatve.  PAST MEDICAL HISTORY: Past Medical History  Diagnosis Date  . Prostate cancer ([place])   . Anemia   . Heart murmur     PAST SURGICAL HISTORY: Past Surgical History  Procedure Laterality Date  . Hernia repair      FAMILY HISTORY: Reviewed and unchanged. No reported history of malignancy or chronic disease.     ADVANCED DIRECTIVES:    HEALTH MAINTENANCE: Social History  Substance Use Topics  . Smoking status: Former Smoker    Types: Cigarettes    Quit date: 09/25/2013  . Smokeless tobacco: Not on file  . Alcohol Use: No     Colonoscopy:  PAP:  Bone density:  Lipid panel:  No Known Allergies  Current Outpatient Prescriptions    Medication Sig Dispense Refill  . albuterol (PROVENTIL HFA;VENTOLIN HFA) 108 (90 BASE) MCG/ACT inhaler Inhale 90 mcg into the lungs as needed. 2 puffs by mouth every 6 hours as needed for wheezing    . aspirin 81 MG tablet Take 81 mg by mouth daily. In the morning    . diphenhydrAMINE (BENADRYL) 25 MG tablet Take 25 mg by mouth every 8 (eight) hours as needed for itching.    . docusate sodium (COLACE) 100 MG capsule Take 100 mg by mouth 3 (three) times daily. As needed for constipation    . ENSURE (ENSURE) Take 1 Can by mouth 3 (three) times daily between meals.    . fentaNYL (DURAGESIC - DOSED MCG/HR) 100 MCG/HR Place 1 patch (100 mcg total) onto the skin every 3 (three) days. 10 patch 0  . gabapentin (NEURONTIN) 300 MG capsule Take 1 capsule (300 mg total) by mouth 3 (three) times daily. At bedtme 90 capsule 1  . HYDROmorphone (DILAUDID) 2 MG tablet TAKE ONE TABLET BY MOUTH EVERY DAY 30 tablet 0  . lidocaine-prilocaine (EMLA) cream     . loratadine (CLARITIN) 10 MG tablet Take 10 mg by mouth daily. 1 tablet orally once a day (only on the day before chemotherapy, the day of chemotherapy and the day after chemotherapy.)    . metoprolol (LOPRESSOR) 50 MG tablet Take 1 tablet (50 mg total) by mouth 2 (two) times daily. 60 tablet 2  . polyethylene glycol (MIRALAX / GLYCOLAX) packet Take 17 g by mouth 2 (  two) times daily.    Marland Kitchen venlafaxine (EFFEXOR) 37.5 MG tablet Take 1 tablet (37.5 mg total) by mouth at bedtime. 30 tablet 2  . Vitamin D, Ergocalciferol, (DRISDOL) 50000 UNITS CAPS capsule Take 50,000 Units by mouth every 7 (seven) days.    . Vitamin D, Ergocalciferol, (DRISDOL) 50000 UNITS CAPS capsule Take 50,000 Units by mouth.    . calcium gluconate 500 MG tablet Take 1 tablet (500 mg total) by mouth 3 (three) times daily. 90 tablet 2  . dexamethasone (DECADRON) 4 MG tablet At [facility] take the night before chemotherapy treatments (Patient not taking: Reported on 04/11/2015) 30 tablet 0  .  fentaNYL (DURAGESIC - DOSED MCG/HR) 50 MCG/HR Place 1 patch (50 mcg total) onto the skin every 3 (three) days. (Patient not taking: Reported on 04/11/2015) 10 patch 0  . metoprolol tartrate (LOPRESSOR) 25 MG tablet Take 25 mg by mouth.  2  . ranitidine (ZANTAC) 150 MG tablet Take 1 tablet (150 mg total) by mouth 2 (two) times daily. 60 tablet 2   No current facility-administered medications for this visit.   Facility-Administered Medications Ordered in Other Visits  Medication Dose Route Frequency Provider Last Rate Last Dose  . sodium chloride 0.9 % injection 10 mL  10 mL Intravenous PRN Lloyd Huger, MD   10 mL at 01/10/15 1508    OBJECTIVE: Filed Vitals:   04/11/15 1335  BP: 108/71  Pulse: 84  Temp: 98.8 F (37.1 C)  Resp: 18     Body mass index is 33.26 kg/(m^2).    ECOG FS:1 - Symptomatic but completely ambulatory  General: Well-developed, well-nourished, no acute distress. Eyes: anicteric sclera. Lungs: Clear to auscultation bilaterally. Heart: Regular rate and rhythm. No rubs, murmurs, or gallops. Abdomen: Soft, nontender, nondistended. No organomegaly noted, normoactive bowel sounds. Musculoskeletal: No edema, cyanosis, or clubbing. Neuro: Alert, answering all questions appropriately. Cranial nerves grossly intact. Skin: No rashes or petechiae noted. Psych: Normal affect.   LAB RESULTS:  Lab Results  Component Value Date   NA 138 04/16/2015   K 4.2 04/16/2015   CL 109 04/16/2015   CO2 24 04/16/2015   GLUCOSE 129* 04/16/2015   BUN 10 04/16/2015   CREATININE 0.88 04/16/2015   CALCIUM 9.1 04/16/2015   PROT 6.3* 04/09/2015   ALBUMIN 3.7 04/09/2015   AST 30 04/09/2015   ALT 26 04/09/2015   ALKPHOS 95 04/09/2015   BILITOT 0.5 04/09/2015   GFRNONAA >60 04/16/2015   GFRAA >60 04/16/2015    Lab Results  Component Value Date   WBC 5.2 04/16/2015   NEUTROABS 2.1 04/09/2015   HGB 11.5* 04/16/2015   HCT 35.5* 04/16/2015   MCV 87.6 04/16/2015   PLT 277  04/16/2015     STUDIES: Dg Chest 2 View  04/17/2015  CLINICAL DATA:  59 year old male with weakness and shortness of breath since 9 p.m. this evening. EXAM: CHEST  2 VIEW COMPARISON:  Chest CT 04/10/2015.  Chest x-ray 09/21/2014. FINDINGS: Lung volumes are normal. No consolidative airspace disease. No pleural effusions. No pneumothorax. No pulmonary nodule or mass noted. Pulmonary vasculature and the cardiomediastinal silhouette are within normal limits. Right internal jugular single-lumen porta cath with tip terminating at the superior cavoatrial junction. Diffuse sclerosis throughout the visualized axial and appendicular skeleton, compatible with widespread osseous metastasis in this patient with history of prostate cancer. IMPRESSION: 1. No radiographic evidence of acute cardiopulmonary disease. 2. Diffuse osseous metastasis redemonstrated. Electronically Signed   By: Vinnie Langton M.D.   On:  04/17/2015 00:10   Ct Head Wo Contrast  04/17/2015  CLINICAL DATA:  Acute onset of weakness and shortness of breath. Tachycardia. Initial encounter. EXAM: CT HEAD WITHOUT CONTRAST TECHNIQUE: Contiguous axial images were obtained from the base of the skull through the vertex without intravenous contrast. COMPARISON:  None. FINDINGS: There is no evidence of acute infarction, mass lesion, or intra- or extra-axial hemorrhage on CT. Mild periventricular white matter change likely reflects small vessel ischemic microangiopathy. The posterior fossa, including the cerebellum, brainstem and fourth ventricle, is within normal limits. The third and lateral ventricles, and basal ganglia are unremarkable in appearance. The cerebral hemispheres are symmetric in appearance, with normal gray-white differentiation. No mass effect or midline shift is seen. There is no evidence of fracture; visualized osseous structures are unremarkable in appearance. The visualized portions of the orbits are within normal limits. The paranasal  sinuses and mastoid air cells are well-aerated. No significant soft tissue abnormalities are seen. IMPRESSION: 1. No acute intracranial pathology seen on CT. 2. Mild small vessel ischemic microangiopathy. Electronically Signed   By: Garald Balding M.D.   On: 04/17/2015 00:44   Ct Chest W Contrast  04/10/2015  CLINICAL DATA:  History of prostate cancer. EXAM: CT CHEST, ABDOMEN, AND PELVIS WITH CONTRAST TECHNIQUE: Multidetector CT imaging of the chest, abdomen and pelvis was performed following the standard protocol during bolus administration of intravenous contrast. CONTRAST:  166mL OMNIPAQUE IOHEXOL 300 MG/ML  SOLN COMPARISON:  CT abdomen and pelvis from 11/14/2014 in CT chest from 05/12/2014 FINDINGS: CT CHEST FINDINGS Mediastinum/Nodes: Normal heart size. No pericardial effusion identified. The trachea appears patent and is midline. Unremarkable appearance of the esophagus. Low attenuation nodules are again noted within the thyroid gland. The largest is in the left lobe measuring 2.2 cm. There is no mediastinal or hilar adenopathy identified. Right axillary node measures 1 cm, image 15/ series 2. Unchanged from previous exam. Lungs/Pleura: No pleural fluid identified. There is no airspace consolidation or atelectasis. No suspicious pulmonary parenchymal nodule or mass identified. Musculoskeletal: Extensive bone metastases are again identified. Lesions are mixed lytic and sclerotic. When compared with the previous chest CT from 05/12/2014 there appears to be increase sclerosis associated with these lesions suggesting healing. There are pathologic fractures involving T9 and T12. CT ABDOMEN PELVIS FINDINGS Hepatobiliary: There is no suspicious liver abnormality. The gallbladder appears normal. No biliary dilatation. Pancreas: Normal appearance of the pancreas. Spleen: Negative. Adrenals/Urinary Tract: Normal appearance of the adrenal glands. No hydronephrosis identified. Left renal cyst is noted. Mild diffuse  bladder wall thickening involving the urinary bladder is identified. Stomach/Bowel: The stomach is within normal limits. The small bowel loops have a normal course and caliber. No obstruction. Normal appearance of the colon. Vascular/Lymphatic: Normal appearance of the abdominal aorta. No aneurysm. Retroperitoneal adenopathy is again noted. Index node measures 1.5 cm, image 64/ series 2. Previously 1.6 cm. Index node at the aortic bifurcation has a short axis measuring 1.3 cm, image 75/series 2. Unchanged from previous exam. Index left common iliac lymph node Measures 1.1 cm, image 80 of series 2. Previously 1.3 cm. Just below the bifurcation of the left common iliac artery there is a 1.1 cm lymph node, image 93 of series 2. Previously this measured the same. Reproductive: Prostate gland appears enlarged. Other: No free fluid or fluid collections within the abdomen or pelvis. Periumbilical hernia is noted which contains fat only. Musculoskeletal: Mixed lytic and sclerotic bone metastases again noted. The appearance is not significantly changed compared with previous  exam. Similar appearance of L2 pathologic fracture. IMPRESSION: 1. Extensive bone metastases are again identified. When compared with previous chest CT from 05/12/2014 there is increased sclerosis suggesting healing. No significant change in the appearance of the visualized osseous structures compared with 11/14/2014. When compared with 11/14/2014 the mixed lytic and sclerotic bone lesions do not appear significantly changed in the interval. There are pathologic fractures involving the T9, T12 and L2. 2. Stable appearance of upper abdominal and pelvic adenopathy. Electronically Signed   By: Kerby Moors M.D.   On: 04/10/2015 10:21   Ct Abdomen Pelvis W Contrast  04/10/2015  CLINICAL DATA:  History of prostate cancer. EXAM: CT CHEST, ABDOMEN, AND PELVIS WITH CONTRAST TECHNIQUE: Multidetector CT imaging of the chest, abdomen and pelvis was performed  following the standard protocol during bolus administration of intravenous contrast. CONTRAST:  149mL OMNIPAQUE IOHEXOL 300 MG/ML  SOLN COMPARISON:  CT abdomen and pelvis from 11/14/2014 in CT chest from 05/12/2014 FINDINGS: CT CHEST FINDINGS Mediastinum/Nodes: Normal heart size. No pericardial effusion identified. The trachea appears patent and is midline. Unremarkable appearance of the esophagus. Low attenuation nodules are again noted within the thyroid gland. The largest is in the left lobe measuring 2.2 cm. There is no mediastinal or hilar adenopathy identified. Right axillary node measures 1 cm, image 15/ series 2. Unchanged from previous exam. Lungs/Pleura: No pleural fluid identified. There is no airspace consolidation or atelectasis. No suspicious pulmonary parenchymal nodule or mass identified. Musculoskeletal: Extensive bone metastases are again identified. Lesions are mixed lytic and sclerotic. When compared with the previous chest CT from 05/12/2014 there appears to be increase sclerosis associated with these lesions suggesting healing. There are pathologic fractures involving T9 and T12. CT ABDOMEN PELVIS FINDINGS Hepatobiliary: There is no suspicious liver abnormality. The gallbladder appears normal. No biliary dilatation. Pancreas: Normal appearance of the pancreas. Spleen: Negative. Adrenals/Urinary Tract: Normal appearance of the adrenal glands. No hydronephrosis identified. Left renal cyst is noted. Mild diffuse bladder wall thickening involving the urinary bladder is identified. Stomach/Bowel: The stomach is within normal limits. The small bowel loops have a normal course and caliber. No obstruction. Normal appearance of the colon. Vascular/Lymphatic: Normal appearance of the abdominal aorta. No aneurysm. Retroperitoneal adenopathy is again noted. Index node measures 1.5 cm, image 64/ series 2. Previously 1.6 cm. Index node at the aortic bifurcation has a short axis measuring 1.3 cm, image  75/series 2. Unchanged from previous exam. Index left common iliac lymph node Measures 1.1 cm, image 80 of series 2. Previously 1.3 cm. Just below the bifurcation of the left common iliac artery there is a 1.1 cm lymph node, image 93 of series 2. Previously this measured the same. Reproductive: Prostate gland appears enlarged. Other: No free fluid or fluid collections within the abdomen or pelvis. Periumbilical hernia is noted which contains fat only. Musculoskeletal: Mixed lytic and sclerotic bone metastases again noted. The appearance is not significantly changed compared with previous exam. Similar appearance of L2 pathologic fracture. IMPRESSION: 1. Extensive bone metastases are again identified. When compared with previous chest CT from 05/12/2014 there is increased sclerosis suggesting healing. No significant change in the appearance of the visualized osseous structures compared with 11/14/2014. When compared with 11/14/2014 the mixed lytic and sclerotic bone lesions do not appear significantly changed in the interval. There are pathologic fractures involving the T9, T12 and L2. 2. Stable appearance of upper abdominal and pelvic adenopathy. Electronically Signed   By: Kerby Moors M.D.   On: 04/10/2015 10:21  ASSESSMENT: Stage IV prostate cancer with diffuse retroperitoneal lymphadenopathy as well as bony metastasis.  PLAN:    1. Prostate cancer: CT scan results reviewed independently and reported as above with essentially stable disease. Patient did not undergo his nuclear med bone scan. This will need to be rescheduled. He has now completed 12 cycles of Taxotere. Proceed with 80mg  degarelix and 120 mg Xgeva today. Return to clinic in 4 weeks with repeat laboratory work and continued treatment. Patient recently completed a course of palliative XRT. If progression on Taxotere, can consider cabazitaxel in the future.  2. Pain: Continue current narcotic regimen. 3. Peripheral neuropathy: Secondary  to Taxotere, monitor. 4. Hyperglycemia: Continue current diabetic medications as prescribed.  Patient expressed understanding and was in agreement with this plan. He also understands that He can call clinic at any time with any questions, concerns, or complaints.     Lloyd Huger, MD   04/25/2015 1:36 PM

## 2015-04-27 ENCOUNTER — Other Ambulatory Visit: Payer: Self-pay | Admitting: Family Medicine

## 2015-04-27 ENCOUNTER — Telehealth: Payer: Self-pay | Admitting: *Deleted

## 2015-04-27 MED ORDER — METOPROLOL TARTRATE 25 MG PO TABS: 25.0000 mg | ORAL_TABLET | Freq: Every day | ORAL | Status: AC

## 2015-04-27 MED ORDER — FENTANYL 100 MCG/HR TD PT72: 100.0000 ug | MEDICATED_PATCH | TRANSDERMAL | Status: DC

## 2015-04-27 NOTE — Telephone Encounter (Signed)
Informed that prescription is ready to pick up  

## 2015-05-04 ENCOUNTER — Encounter
Admission: RE | Admit: 2015-05-04 | Discharge: 2015-05-04 | Disposition: A | Discharge status: Home / Self Care | Payer: BLUE CROSS/BLUE SHIELD | Source: Ambulatory Visit | Attending: Oncology | Admitting: Oncology

## 2015-05-04 ENCOUNTER — Other Ambulatory Visit: Payer: Self-pay | Admitting: Family Medicine

## 2015-05-04 ENCOUNTER — Encounter: Admission: RE | Admit: 2015-05-04 | Payer: BLUE CROSS/BLUE SHIELD | Source: Ambulatory Visit

## 2015-05-04 DIAGNOSIS — C61 Malignant neoplasm of prostate: Secondary | ICD-10-CM

## 2015-05-04 MED ORDER — TECHNETIUM TC 99M MEDRONATE IV KIT
25.0000 | PACK | Freq: Once | INTRAVENOUS | Status: AC | PRN
  Administered 2015-05-04: 21.07 via INTRAVENOUS

## 2015-05-04 MED ORDER — HYDROMORPHONE HCL 2 MG PO TABS: 2.0000 mg | ORAL_TABLET | Freq: Every day | ORAL | Status: DC

## 2015-05-08 ENCOUNTER — Other Ambulatory Visit: Payer: Self-pay | Admitting: *Deleted

## 2015-05-08 DIAGNOSIS — C61 Malignant neoplasm of prostate: Secondary | ICD-10-CM

## 2015-05-09 ENCOUNTER — Inpatient Hospital Stay: Payer: BLUE CROSS/BLUE SHIELD | Admitting: Oncology

## 2015-05-09 ENCOUNTER — Inpatient Hospital Stay: Payer: BLUE CROSS/BLUE SHIELD

## 2015-05-14 ENCOUNTER — Inpatient Hospital Stay: Payer: BLUE CROSS/BLUE SHIELD

## 2015-05-14 ENCOUNTER — Inpatient Hospital Stay: Payer: BLUE CROSS/BLUE SHIELD | Attending: Internal Medicine | Admitting: Internal Medicine

## 2015-05-14 ENCOUNTER — Telehealth: Payer: Self-pay | Admitting: *Deleted

## 2015-05-14 ENCOUNTER — Encounter: Payer: Self-pay | Admitting: Internal Medicine

## 2015-05-14 VITALS — BP 114/78 | HR 83 | Temp 98.6°F | Resp 18 | Ht 70.0 in | Wt 227.1 lb

## 2015-05-14 DIAGNOSIS — R590 Localized enlarged lymph nodes: Secondary | ICD-10-CM

## 2015-05-14 DIAGNOSIS — C61 Malignant neoplasm of prostate: Secondary | ICD-10-CM

## 2015-05-14 DIAGNOSIS — R0602 Shortness of breath: Secondary | ICD-10-CM

## 2015-05-14 DIAGNOSIS — G620 Drug-induced polyneuropathy: Secondary | ICD-10-CM

## 2015-05-14 DIAGNOSIS — E1165 Type 2 diabetes mellitus with hyperglycemia: Secondary | ICD-10-CM

## 2015-05-14 DIAGNOSIS — Z79899 Other long term (current) drug therapy: Secondary | ICD-10-CM

## 2015-05-14 DIAGNOSIS — R002 Palpitations: Secondary | ICD-10-CM

## 2015-05-14 DIAGNOSIS — Z79818 Long term (current) use of other agents affecting estrogen receptors and estrogen levels: Secondary | ICD-10-CM

## 2015-05-14 DIAGNOSIS — C7951 Secondary malignant neoplasm of bone: Secondary | ICD-10-CM

## 2015-05-14 DIAGNOSIS — R5381 Other malaise: Secondary | ICD-10-CM

## 2015-05-14 DIAGNOSIS — Z87891 Personal history of nicotine dependence: Secondary | ICD-10-CM | POA: Insufficient documentation

## 2015-05-14 DIAGNOSIS — Z7982 Long term (current) use of aspirin: Secondary | ICD-10-CM | POA: Insufficient documentation

## 2015-05-14 DIAGNOSIS — T451X5S Adverse effect of antineoplastic and immunosuppressive drugs, sequela: Secondary | ICD-10-CM

## 2015-05-14 DIAGNOSIS — R5383 Other fatigue: Secondary | ICD-10-CM

## 2015-05-14 LAB — CBC WITH DIFFERENTIAL/PLATELET
Basophils Absolute: 0 10*3/uL (ref 0–0.1)
Basophils Relative: 0 %
EOS ABS: 0.3 10*3/uL (ref 0–0.7)
Eosinophils Relative: 6 %
HCT: 34.7 % — ABNORMAL LOW (ref 40.0–52.0)
HEMOGLOBIN: 11.3 g/dL — AB (ref 13.0–18.0)
LYMPHS ABS: 1.3 10*3/uL (ref 1.0–3.6)
Lymphocytes Relative: 24 %
MCH: 28.5 pg (ref 26.0–34.0)
MCHC: 32.5 g/dL (ref 32.0–36.0)
MCV: 87.7 fL (ref 80.0–100.0)
MONOS PCT: 11 %
Monocytes Absolute: 0.6 10*3/uL (ref 0.2–1.0)
NEUTROS PCT: 59 %
Neutro Abs: 3.2 10*3/uL (ref 1.4–6.5)
Platelets: 240 10*3/uL (ref 150–440)
RBC: 3.95 MIL/uL — ABNORMAL LOW (ref 4.40–5.90)
RDW: 16.1 % — ABNORMAL HIGH (ref 11.5–14.5)
WBC: 5.4 10*3/uL (ref 3.8–10.6)

## 2015-05-14 LAB — PSA: PSA: 0.05 ng/mL (ref 0.00–4.00)

## 2015-05-14 LAB — BASIC METABOLIC PANEL
ANION GAP: 6 (ref 5–15)
BUN: 8 mg/dL (ref 6–20)
CALCIUM: 8.9 mg/dL (ref 8.9–10.3)
CO2: 24 mmol/L (ref 22–32)
CREATININE: 0.8 mg/dL (ref 0.61–1.24)
Chloride: 108 mmol/L (ref 101–111)
GFR calc Af Amer: >60 mL/min (ref >60)
GFR calc non Af Amer: >60 mL/min (ref >60)
Glucose, Bld: 124 mg/dL — ABNORMAL HIGH (ref 65–99)
Potassium: 3.9 mmol/L (ref 3.5–5.1)
Sodium: 138 mmol/L (ref 135–145)

## 2015-05-14 LAB — PHOSPHORUS: Phosphorus: 3.9 mg/dL (ref 2.5–4.6)

## 2015-05-14 MED ORDER — SODIUM CHLORIDE 0.9 % IJ SOLN
10.0000 mL | INTRAMUSCULAR | Status: DC | PRN
  Administered 2015-05-14: 10 mL via INTRAVENOUS
  Filled 2015-05-14: qty 10

## 2015-05-14 MED ORDER — HEPARIN SOD (PORK) LOCK FLUSH 100 UNIT/ML IV SOLN
500.0000 [IU] | Freq: Once | INTRAVENOUS | Status: AC
  Administered 2015-05-14: 500 [IU] via INTRAVENOUS
  Filled 2015-05-14: qty 5

## 2015-05-14 MED ORDER — DENOSUMAB 120 MG/1.7ML ~~LOC~~ SOLN
120.0000 mg | Freq: Once | SUBCUTANEOUS | Status: AC
  Administered 2015-05-14: 120 mg via SUBCUTANEOUS
  Filled 2015-05-14: qty 1.7

## 2015-05-14 MED ORDER — DEGARELIX ACETATE 80 MG ~~LOC~~ SOLR
80.0000 mg | Freq: Once | SUBCUTANEOUS | Status: AC
  Administered 2015-05-14: 80 mg via SUBCUTANEOUS
  Filled 2015-05-14: qty 4

## 2015-05-14 MED ORDER — FENTANYL 100 MCG/HR TD PT72: 100.0000 ug | MEDICATED_PATCH | TRANSDERMAL | Status: AC

## 2015-05-14 NOTE — Progress Notes (Signed)
[place]  [phone] [phone]  ID: Anthony [name] OB: [birth year]  MR#: [id number]  [id number]  Patient Care Team: Tracie Harrier, MD as PCP - General (Internal Medicine)  CHIEF COMPLAINT:  No chief complaint on file.   INTERVAL HISTORY: Patient returns to clinic today for continuation of [place].  Anthony [name] claims that numbness and tingling in his feet continues to persist, but has somewhat improved. He only takes Neurontin once at bedtime. He claims that he occasionally complains of palpitations, which she attributes to injections of Xgeva. He does not complain of weakness or fatigue today. He denies any fevers. He has a good appetite. He denies any chest pain, but does have intermittent shortness of breath. He denies any nausea, vomiting, constipation, or diarrhea. He has no urinary complaints. Patient offers no further specific complaints today.   REVIEW OF SYSTEMS:   Review of Systems  Constitutional: Positive for malaise/fatigue. Negative for fever and weight loss.  Respiratory: Negative Cardiovascular: Negative.   Gastrointestinal: Negative.   Musculoskeletal: Negative.   Neurological: Negative.     As per HPI. Otherwise, a complete review of systems is negatve.  PAST MEDICAL HISTORY: Past Medical History  Diagnosis Date  . Prostate cancer ([place])   . Anemia   . Heart murmur     PAST SURGICAL HISTORY: Past Surgical History  Procedure Laterality Date  . Hernia repair      FAMILY HISTORY: Reviewed and unchanged. No reported history of malignancy or chronic disease.     ADVANCED DIRECTIVES:    HEALTH MAINTENANCE: Social History  Substance Use Topics  . Smoking status: Former Smoker    Types: Cigarettes    Quit date: 09/25/2013  . Smokeless tobacco: Not on file  . Alcohol Use: No     Colonoscopy:  PAP:  Bone density:  Lipid panel:  No Known Allergies  Current Outpatient Prescriptions    Medication Sig Dispense Refill  . albuterol (PROVENTIL HFA;VENTOLIN HFA) 108 (90 BASE) MCG/ACT inhaler Inhale 90 mcg into the lungs as needed. 2 puffs by mouth every 6 hours as needed for wheezing    . aspirin 81 MG tablet Take 81 mg by mouth daily. In the morning    . calcium gluconate 500 MG tablet Take 1 tablet (500 mg total) by mouth 3 (three) times daily. 90 tablet 2  . dexamethasone (DECADRON) 4 MG tablet At [facility] take the night before chemotherapy treatments (Patient not taking: Reported on 04/11/2015) 30 tablet 0  . diphenhydrAMINE (BENADRYL) 25 MG tablet Take 25 mg by mouth every 8 (eight) hours as needed for itching.    . docusate sodium (COLACE) 100 MG capsule Take 100 mg by mouth 3 (three) times daily. As needed for constipation    . ENSURE (ENSURE) Take 1 Can by mouth 3 (three) times daily between meals.    . fentaNYL (DURAGESIC - DOSED MCG/HR) 100 MCG/HR Place 1 patch (100 mcg total) onto the skin every 3 (three) days. 10 patch 0  . gabapentin (NEURONTIN) 300 MG capsule Take 1 capsule (300 mg total) by mouth 3 (three) times daily. At bedtme 90 capsule 1  . HYDROmorphone (DILAUDID) 2 MG tablet Take 1 tablet (2 mg total) by mouth daily. 30 tablet 0  . lidocaine-prilocaine (EMLA) cream     . loratadine (CLARITIN) 10 MG tablet Take 10 mg by mouth daily. 1 tablet orally once a day (only on the day before chemotherapy, the day of chemotherapy and the day after  chemotherapy.)    . metoprolol tartrate (LOPRESSOR) 25 MG tablet Take 1 tablet (25 mg total) by mouth daily. 30 tablet 2  . polyethylene glycol (MIRALAX / GLYCOLAX) packet Take 17 g by mouth 2 (two) times daily.    . ranitidine (ZANTAC) 150 MG tablet Take 1 tablet (150 mg total) by mouth 2 (two) times daily. 60 tablet 2  . venlafaxine (EFFEXOR) 37.5 MG tablet Take 1 tablet (37.5 mg total) by mouth at bedtime. 30 tablet 2  . Vitamin D, Ergocalciferol, (DRISDOL) 50000 UNITS CAPS capsule Take 50,000 Units by mouth every 7  (seven) days.    . Vitamin D, Ergocalciferol, (DRISDOL) 50000 UNITS CAPS capsule Take 50,000 Units by mouth.     No current facility-administered medications for this visit.   Facility-Administered Medications Ordered in Other Visits  Medication Dose Route Frequency Provider Last Rate Last Dose  . sodium chloride 0.9 % injection 10 mL  10 mL Intravenous PRN Anthony Huger, MD   10 mL at 01/10/15 1508  . sodium chloride 0.9 % injection 10 mL  10 mL Intravenous PRN Anthony Huger, MD   10 mL at 05/14/15 0827    OBJECTIVE: Filed Vitals:   05/14/15 0855  BP: 114/78  Pulse: 83  Temp: 98.6 F (37 C)  Resp: 18     Body mass index is 32.58 kg/(m^2).    ECOG FS:1 - Symptomatic but completely ambulatory  General: Well-developed, well-nourished, obese, no acute distress. African-American male Eyes: anicteric sclera. Lungs: Clear to auscultation bilaterally. Heart: Regular rate and rhythm. No rubs, murmurs, or gallops. Abdomen: Soft, nontender, nondistended. No organomegaly noted, normoactive bowel sounds. Musculoskeletal: No edema, cyanosis, or clubbing. Neuro: Alert, answering all questions appropriately. Cranial nerves grossly intact. Skin: No rashes or petechiae noted. Psych: Normal affect.   LAB RESULTS:  Lab Results  Component Value Date   NA 138 04/16/2015   K 4.2 04/16/2015   CL 109 04/16/2015   CO2 24 04/16/2015   GLUCOSE 129* 04/16/2015   BUN 10 04/16/2015   CREATININE 0.88 04/16/2015   CALCIUM 9.1 04/16/2015   PROT 6.3* 04/09/2015   ALBUMIN 3.7 04/09/2015   AST 30 04/09/2015   ALT 26 04/09/2015   ALKPHOS 95 04/09/2015   BILITOT 0.5 04/09/2015   GFRNONAA >60 04/16/2015   GFRAA >60 04/16/2015    Lab Results  Component Value Date   WBC 5.2 04/16/2015   NEUTROABS 2.1 04/09/2015   HGB 11.5* 04/16/2015   HCT 35.5* 04/16/2015   MCV 87.6 04/16/2015   PLT 277 04/16/2015     STUDIES: Dg Chest 2 View  04/17/2015  CLINICAL DATA:  59 year old male with  weakness and shortness of breath since 9 p.m. this evening. EXAM: CHEST  2 VIEW COMPARISON:  Chest CT 04/10/2015.  Chest x-ray 09/21/2014. FINDINGS: Lung volumes are normal. No consolidative airspace disease. No pleural effusions. No pneumothorax. No pulmonary nodule or mass noted. Pulmonary vasculature and the cardiomediastinal silhouette are within normal limits. Right internal jugular single-lumen porta cath with tip terminating at the superior cavoatrial junction. Diffuse sclerosis throughout the visualized axial and appendicular skeleton, compatible with widespread osseous metastasis in this patient with history of prostate cancer. IMPRESSION: 1. No radiographic evidence of acute cardiopulmonary disease. 2. Diffuse osseous metastasis redemonstrated. Electronically Signed   By: Vinnie Langton M.D.   On: 04/17/2015 00:10   Ct Head Wo Contrast  04/17/2015  CLINICAL DATA:  Acute onset of weakness and shortness of breath. Tachycardia. Initial encounter. EXAM: CT  HEAD WITHOUT CONTRAST TECHNIQUE: Contiguous axial images were obtained from the base of the skull through the vertex without intravenous contrast. COMPARISON:  None. FINDINGS: There is no evidence of acute infarction, mass lesion, or intra- or extra-axial hemorrhage on CT. Mild periventricular white matter change likely reflects small vessel ischemic microangiopathy. The posterior fossa, including the cerebellum, brainstem and fourth ventricle, is within normal limits. The third and lateral ventricles, and basal ganglia are unremarkable in appearance. The cerebral hemispheres are symmetric in appearance, with normal gray-white differentiation. No mass effect or midline shift is seen. There is no evidence of fracture; visualized osseous structures are unremarkable in appearance. The visualized portions of the orbits are within normal limits. The paranasal sinuses and mastoid air cells are well-aerated. No significant soft tissue abnormalities are seen.  IMPRESSION: 1. No acute intracranial pathology seen on CT. 2. Mild small vessel ischemic microangiopathy. Electronically Signed   By: Garald Balding M.D.   On: 04/17/2015 00:44   Nm Bone Scan Whole Body  05/04/2015  CLINICAL DATA:  Prostate cancer. EXAM: NUCLEAR MEDICINE WHOLE BODY BONE SCAN TECHNIQUE: Whole body anterior and posterior images were obtained approximately 3 hours after intravenous injection of radiopharmaceutical. RADIOPHARMACEUTICALS:  21.07 mCi Technetium-77m MDP IV COMPARISON:  04/24/2014. FINDINGS: Bilateral renal function and excretion is present. Previously identified innumerable foci of increased activity noted throughout the shoulders, spine, ribs, and pelvis have improved. Residual focal areas of increased activity or noted in all these regions. New no significant new lesions identified. IMPRESSION: Persistent but significant improvement of diffuse metastatic disease from prior bone scan of 04/24/2014. No definite new lesions. Electronically Signed   By: Marcello Moores  Register   On: 05/04/2015 14:43    ASSESSMENT: Stage IV prostate cancer with diffuse retroperitoneal lymphadenopathy as well as bony metastasis.  PLAN:    1. Prostate cancer: I personally reviewed the images of bone scan, which showed significant reduction in bone metastasis. He has now completed 12 cycles of Taxotere. We will continue with every 4 week 80mg  degarelix and 120 mg Xgeva today. Return to clinic in 4 weeks and 8 weeks with repeat laboratory work and continued treatment. Patient recently completed a course of palliative XRT. If progression is seen on future imaging, can consider cabazitaxel in the future. We will check PSA today. We will also check phosphorus level prior to next Xgeva. Calcium levels have been stable on oral supplementation. 2. Pain: Continue current narcotic regimen. Refill for Duragesic patch has been issued. 3. 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 We recommended to take Neurontin 3 times a day rather than once a night, but ensure that there is no somnolence which would interfere with his activities of daily living, including driving. 4. Hyperglycemia: Continue current diabetic medications as prescribed.  Patient expressed understanding and was in agreement with this plan. He also understands that He can call clinic at any time with any questions, concerns, or complaints.    She will have an M.D. visit in 12 weeks  Roxana Hires, MD   05/14/2015 8:31 AM

## 2015-05-14 NOTE — Progress Notes (Signed)
Pt here for scan results and to see if he will stay with firmagon and xgeva.  He states he feels neuropathy is better but in checking his meds he has not been taking there gabapentin tid.  His directions say tid , bedtime.  And he has only been taking it at night.  The rx was for 90 pills so I have encouraged him to take it tid.  He stopped venlafaxine in which he was being weaned off due to sleep disturbance.  Also he was on dexamethasone for taxotere and taxotere was d/c due to neuropathy.  Pt eating well.  Pt using stool softner and only using miralax once a week.

## 2015-05-14 NOTE — Telephone Encounter (Signed)
Pt wanted labs and documents since he is having an appt next day.  Notes, labs , and radiology sent to fax 3 given [phone]

## 2015-05-21 ENCOUNTER — Telehealth: Payer: Self-pay | Admitting: *Deleted

## 2015-05-21 NOTE — Telephone Encounter (Signed)
Has refills left 

## 2015-05-22 ENCOUNTER — Ambulatory Visit: Payer: BLUE CROSS/BLUE SHIELD

## 2015-05-22 ENCOUNTER — Telehealth: Payer: Self-pay

## 2015-05-22 NOTE — Telephone Encounter (Signed)
T/C to patient about SCP visit.  Pt can only come on Mondays and SCP visits are only offered on Tuesday and Thursday afternoons.  Informed patient SCP will be mailed to him and to contact me for any questions or concerns.  Treatment summary and ASCO answers booklet mailed to patient.

## 2015-05-23 ENCOUNTER — Inpatient Hospital Stay: Payer: BLUE CROSS/BLUE SHIELD

## 2015-05-28 ENCOUNTER — Ambulatory Visit: Payer: BLUE CROSS/BLUE SHIELD | Admitting: Oncology

## 2015-05-28 ENCOUNTER — Ambulatory Visit: Payer: BLUE CROSS/BLUE SHIELD

## 2015-05-28 ENCOUNTER — Other Ambulatory Visit: Payer: BLUE CROSS/BLUE SHIELD

## 2015-06-11 ENCOUNTER — Inpatient Hospital Stay: Payer: BLUE CROSS/BLUE SHIELD | Attending: Oncology

## 2015-06-11 ENCOUNTER — Inpatient Hospital Stay: Payer: BLUE CROSS/BLUE SHIELD

## 2015-06-11 ENCOUNTER — Other Ambulatory Visit: Payer: BLUE CROSS/BLUE SHIELD

## 2015-07-04 ENCOUNTER — Inpatient Hospital Stay: Payer: BLUE CROSS/BLUE SHIELD

## 2015-07-09 ENCOUNTER — Inpatient Hospital Stay: Payer: Self-pay | Attending: Oncology

## 2015-07-09 ENCOUNTER — Other Ambulatory Visit: Payer: BLUE CROSS/BLUE SHIELD

## 2015-07-09 ENCOUNTER — Inpatient Hospital Stay: Payer: Self-pay

## 2015-07-09 VITALS — BP 113/76 | HR 80 | Temp 97.1°F | Resp 18

## 2015-07-09 DIAGNOSIS — C61 Malignant neoplasm of prostate: Secondary | ICD-10-CM | POA: Insufficient documentation

## 2015-07-09 DIAGNOSIS — C7951 Secondary malignant neoplasm of bone: Secondary | ICD-10-CM | POA: Insufficient documentation

## 2015-07-09 DIAGNOSIS — Z79899 Other long term (current) drug therapy: Secondary | ICD-10-CM | POA: Insufficient documentation

## 2015-07-09 DIAGNOSIS — Z79818 Long term (current) use of other agents affecting estrogen receptors and estrogen levels: Secondary | ICD-10-CM | POA: Insufficient documentation

## 2015-07-09 LAB — COMPREHENSIVE METABOLIC PANEL
ALT: 22 U/L (ref 17–63)
AST: 25 U/L (ref 15–41)
Albumin: 4 g/dL (ref 3.5–5.0)
Alkaline Phosphatase: 122 U/L (ref 38–126)
Anion gap: 6 (ref 5–15)
BILIRUBIN TOTAL: 0.4 mg/dL (ref 0.3–1.2)
BUN: 9 mg/dL (ref 6–20)
CALCIUM: 8.5 mg/dL — AB (ref 8.9–10.3)
CHLORIDE: 102 mmol/L (ref 101–111)
CO2: 26 mmol/L (ref 22–32)
CREATININE: 0.83 mg/dL (ref 0.61–1.24)
Glucose, Bld: 150 mg/dL — ABNORMAL HIGH (ref 65–99)
Potassium: 3.8 mmol/L (ref 3.5–5.1)
Sodium: 134 mmol/L — ABNORMAL LOW (ref 135–145)
TOTAL PROTEIN: 7.5 g/dL (ref 6.5–8.1)

## 2015-07-09 LAB — PHOSPHORUS: PHOSPHORUS: 4 mg/dL (ref 2.5–4.6)

## 2015-07-09 MED ORDER — DEGARELIX ACETATE 80 MG ~~LOC~~ SOLR
80.0000 mg | Freq: Once | SUBCUTANEOUS | Status: AC
  Administered 2015-07-09: 80 mg via SUBCUTANEOUS
  Filled 2015-07-09: qty 4

## 2015-07-09 MED ORDER — DENOSUMAB 120 MG/1.7ML ~~LOC~~ SOLN
120.0000 mg | Freq: Once | SUBCUTANEOUS | Status: AC
  Administered 2015-07-09: 120 mg via SUBCUTANEOUS
  Filled 2015-07-09: qty 1.7

## 2015-07-09 MED ORDER — SODIUM CHLORIDE 0.9 % IJ SOLN
10.0000 mL | Freq: Once | INTRAMUSCULAR | Status: AC
  Administered 2015-07-09: 10 mL via INTRAVENOUS
  Filled 2015-07-09: qty 10

## 2015-07-09 MED ORDER — HEPARIN SOD (PORK) LOCK FLUSH 100 UNIT/ML IV SOLN
INTRAVENOUS | Status: AC
  Filled 2015-07-09: qty 5

## 2015-07-09 MED ORDER — HEPARIN SOD (PORK) LOCK FLUSH 100 UNIT/ML IV SOLN
500.0000 [IU] | Freq: Once | INTRAVENOUS | Status: AC
  Administered 2015-07-09: 500 [IU] via INTRAVENOUS

## 2015-08-01 ENCOUNTER — Encounter: Payer: Self-pay | Admitting: Emergency Medicine

## 2015-08-01 ENCOUNTER — Emergency Department
Admission: EM | Admit: 2015-08-01 | Discharge: 2015-08-01 | Disposition: A | Discharge status: Home / Self Care | Payer: Self-pay | Attending: Emergency Medicine | Admitting: Emergency Medicine

## 2015-08-01 DIAGNOSIS — Z79899 Other long term (current) drug therapy: Secondary | ICD-10-CM | POA: Insufficient documentation

## 2015-08-01 DIAGNOSIS — Z87891 Personal history of nicotine dependence: Secondary | ICD-10-CM | POA: Insufficient documentation

## 2015-08-01 DIAGNOSIS — Z7982 Long term (current) use of aspirin: Secondary | ICD-10-CM | POA: Insufficient documentation

## 2015-08-01 DIAGNOSIS — M5431 Sciatica, right side: Secondary | ICD-10-CM | POA: Insufficient documentation

## 2015-08-01 MED ORDER — HYDROCODONE-ACETAMINOPHEN 5-325 MG PO TABS: 1.0000 | ORAL_TABLET | ORAL | Status: AC | PRN

## 2015-08-01 MED ORDER — PREDNISONE 10 MG PO TABS: ORAL_TABLET | ORAL | Status: DC

## 2015-08-01 NOTE — ED Notes (Signed)
Having pain to right hip into right leg for the past 3-4 days   Denies any injury

## 2015-08-01 NOTE — ED Provider Notes (Signed)
[facility] Emergency Department Provider Note  ____________________________________________  Time seen: Approximately 1:56 PM  I have reviewed the triage vital signs and the nursing notes.   HISTORY  Chief Complaint Leg Pain   HPI Anthony [name] is a 60 y.o. male . Complaining of right hip pain radiating down his right leg for the past 3-4 days. Patient denies any injury. He has been taking over-the-counter medication without any relief. He states that in the past he has had some problems with his back but no current back pain. He states that he can feel the pain start in his right buttocks and sometimes has a burning numbness sensation that goes down to his knee and occasionally radiates down into his toes. This is worse with standing or walking. He denies any incontinence of bowel or bladder. He denies any peptic ulcer disease.He rates his pain is 7 out of 10.   Past Medical History  Diagnosis Date  . Prostate cancer ([place])   . Anemia   . Heart murmur     Patient Active Problem List   Diagnosis Date Noted  . Prostate cancer ([place]) 11/15/2014    Past Surgical History  Procedure Laterality Date  . Hernia repair      Current Outpatient Rx  Name  Route  Sig  Dispense  Refill  . albuterol (PROVENTIL HFA;VENTOLIN HFA) 108 (90 BASE) MCG/ACT inhaler   Inhalation   Inhale 90 mcg into the lungs as needed. 2 puffs by mouth every 6 hours as needed for wheezing         . aspirin 81 MG tablet   Oral   Take 81 mg by mouth daily. In the morning         . calcium gluconate 500 MG tablet   Oral   Take 1 tablet (500 mg total) by mouth 3 (three) times daily.   90 tablet   2   . diphenhydrAMINE (BENADRYL) 25 MG tablet   Oral   Take 25 mg by mouth every 8 (eight) hours as needed for itching.         . docusate sodium (COLACE) 100 MG capsule   Oral   Take 100 mg by mouth 3 (three) times daily. As needed for constipation         . ENSURE  (ENSURE)   Oral   Take 1 Can by mouth 2 (two) times daily between meals.          . fentaNYL (DURAGESIC - DOSED MCG/HR) 100 MCG/HR   Transdermal   Place 1 patch (100 mcg total) onto the skin every 3 (three) days.   10 patch   0   . gabapentin (NEURONTIN) 300 MG capsule   Oral   Take 1 capsule (300 mg total) by mouth 3 (three) times daily. At bedtme   90 capsule   1   . HYDROcodone-acetaminophen (NORCO/VICODIN) 5-325 MG tablet   Oral   Take 1 tablet by mouth every 4 (four) hours as needed for moderate pain.   20 tablet   0   . HYDROmorphone (DILAUDID) 2 MG tablet   Oral   Take 1 tablet (2 mg total) by mouth daily.   30 tablet   0   . lidocaine-prilocaine (EMLA) cream               . loratadine (CLARITIN) 10 MG tablet   Oral   Take 10 mg by mouth daily. 1 tablet orally once a day (only on  the day before chemotherapy, the day of chemotherapy and the day after chemotherapy.)         . metoprolol tartrate (LOPRESSOR) 25 MG tablet   Oral   Take 1 tablet (25 mg total) by mouth daily.   30 tablet   2   . polyethylene glycol (MIRALAX / GLYCOLAX) packet   Oral   Take 17 g by mouth daily as needed.          . predniSONE (DELTASONE) 10 MG tablet      Take 6 tablets  today, on day 2 take 5 tablets, day 3 take 4 tablets, day 4 take 3 tablets, day 5 take  2 tablets and 1 tablet the last day   21 tablet   0   . ranitidine (ZANTAC) 150 MG tablet   Oral   Take 1 tablet (150 mg total) by mouth 2 (two) times daily.   60 tablet   2   . Vitamin D, Ergocalciferol, (DRISDOL) 50000 UNITS CAPS capsule   Oral   Take 50,000 Units by mouth every 7 (seven) days.           Allergies Review of patient's allergies indicates no known allergies.  Family History  Problem Relation Age of Onset  . Hypertension Mother   . Multiple myeloma Mother   . Lung cancer Father   . Hypertension Father     Social History Social History  Substance Use Topics  . Smoking status:  Former Smoker    Types: Cigarettes    Quit date: 09/25/2013  . Smokeless tobacco: None  . Alcohol Use: No    Review of Systems Constitutional: No fever/chills Cardiovascular: Denies chest pain. Respiratory: Denies shortness of breath. Gastrointestinal: No abdominal pain.  No nausea, no vomiting.  No diarrhea.  No constipation. Genitourinary: Negative for dysuria. Musculoskeletal: Positive for past back pain. Skin: Negative for rash. Neurological: Negative for headaches, focal weakness . Positive for radicular pain right leg.  10-point ROS otherwise negative.  ____________________________________________   PHYSICAL EXAM:  VITAL SIGNS: ED Triage Vitals  Enc Vitals Group     BP 08/01/15 1324 133/84 mmHg     Pulse Rate 08/01/15 1324 99     Resp 08/01/15 1324 18     Temp 08/01/15 1324 98.3 F (36.8 C)     Temp Source 08/01/15 1324 Oral     SpO2 08/01/15 1324 96 %     Weight 08/01/15 1324 200 lb (90.719 kg)     Height 08/01/15 1324 5' 10"  (1.778 m)     Head Cir --      Peak Flow --      Pain Score 08/01/15 1333 7     Pain Loc --      Pain Edu? --      Excl. in [place]? --     Constitutional: Alert and oriented. Well appearing and in no acute distress. Eyes: Conjunctivae are normal. PERRL. EOMI. Head: Atraumatic. Nose: No congestion/rhinnorhea. Neck: No stridor.   Cardiovascular: Normal rate, regular rhythm. Grossly normal heart sounds.  Good peripheral circulation. Respiratory: Normal respiratory effort.  No retractions. Lungs CTAB. Gastrointestinal: Soft and nontender. No distention. No abdominal bruits. No CVA tenderness. Musculoskeletal: Tenderness on palpation of the right SI joint area. There is no point tenderness on palpation of lumbar spine. Range of motion is without restriction for abduction or abduction of the right hip. There is some discomfort with straight leg raises on the right leg being about 30. Neurologic:  Normal speech and language. No gross focal  neurologic deficits are appreciated. No gait instability. Reflexes are 2+ bilaterally. Skin:  Skin is warm, dry and intact. No rash noted. Psychiatric: Mood and affect are normal. Speech and behavior are normal.  ____________________________________________   LABS (all labs ordered are listed, but only abnormal results are displayed)  Labs Reviewed - No data to display  PROCEDURES  Procedure(s) performed: None  Critical Care performed: No  ____________________________________________   INITIAL IMPRESSION / ASSESSMENT AND PLAN / ED COURSE  Pertinent labs & imaging results that were available during my care of the patient were reviewed by me and considered in my medical decision making (see chart for details).  Patient was started on Norco as needed for pain and prednisone 60 mg 6 day taper. Patient is to follow-up with his primary care doctor at [facility]. ____________________________________________   FINAL CLINICAL IMPRESSION(S) / ED DIAGNOSES  Final diagnoses:  Sciatica of right side      Johnn Hai, PA-C 08/01/15 [place], MD 08/01/15 [phone]

## 2015-08-05 ENCOUNTER — Other Ambulatory Visit: Payer: Self-pay | Admitting: *Deleted

## 2015-08-05 DIAGNOSIS — C61 Malignant neoplasm of prostate: Secondary | ICD-10-CM

## 2015-08-06 ENCOUNTER — Inpatient Hospital Stay: Payer: No Typology Code available for payment source

## 2015-08-06 ENCOUNTER — Other Ambulatory Visit: Payer: BLUE CROSS/BLUE SHIELD

## 2015-08-06 ENCOUNTER — Inpatient Hospital Stay: Payer: Self-pay

## 2015-08-06 ENCOUNTER — Inpatient Hospital Stay: Payer: No Typology Code available for payment source | Attending: Internal Medicine | Admitting: Oncology

## 2015-08-06 ENCOUNTER — Encounter: Payer: Self-pay | Admitting: Internal Medicine

## 2015-08-06 VITALS — BP 135/81 | HR 100 | Temp 98.1°F | Resp 18 | Ht 70.0 in | Wt 230.8 lb

## 2015-08-06 DIAGNOSIS — R5383 Other fatigue: Secondary | ICD-10-CM | POA: Insufficient documentation

## 2015-08-06 DIAGNOSIS — C7951 Secondary malignant neoplasm of bone: Secondary | ICD-10-CM | POA: Insufficient documentation

## 2015-08-06 DIAGNOSIS — C772 Secondary and unspecified malignant neoplasm of intra-abdominal lymph nodes: Secondary | ICD-10-CM | POA: Insufficient documentation

## 2015-08-06 DIAGNOSIS — E669 Obesity, unspecified: Secondary | ICD-10-CM | POA: Insufficient documentation

## 2015-08-06 DIAGNOSIS — G620 Drug-induced polyneuropathy: Secondary | ICD-10-CM | POA: Insufficient documentation

## 2015-08-06 DIAGNOSIS — E1165 Type 2 diabetes mellitus with hyperglycemia: Secondary | ICD-10-CM | POA: Insufficient documentation

## 2015-08-06 DIAGNOSIS — C61 Malignant neoplasm of prostate: Secondary | ICD-10-CM

## 2015-08-06 DIAGNOSIS — Z87891 Personal history of nicotine dependence: Secondary | ICD-10-CM | POA: Insufficient documentation

## 2015-08-06 DIAGNOSIS — R5381 Other malaise: Secondary | ICD-10-CM | POA: Insufficient documentation

## 2015-08-06 DIAGNOSIS — Z95828 Presence of other vascular implants and grafts: Secondary | ICD-10-CM

## 2015-08-06 DIAGNOSIS — R61 Generalized hyperhidrosis: Secondary | ICD-10-CM | POA: Insufficient documentation

## 2015-08-06 DIAGNOSIS — T451X5S Adverse effect of antineoplastic and immunosuppressive drugs, sequela: Secondary | ICD-10-CM | POA: Insufficient documentation

## 2015-08-06 DIAGNOSIS — R590 Localized enlarged lymph nodes: Secondary | ICD-10-CM | POA: Insufficient documentation

## 2015-08-06 DIAGNOSIS — Z79899 Other long term (current) drug therapy: Secondary | ICD-10-CM | POA: Insufficient documentation

## 2015-08-06 DIAGNOSIS — Z7982 Long term (current) use of aspirin: Secondary | ICD-10-CM | POA: Insufficient documentation

## 2015-08-06 DIAGNOSIS — M5430 Sciatica, unspecified side: Secondary | ICD-10-CM | POA: Insufficient documentation

## 2015-08-06 LAB — CBC WITH DIFFERENTIAL/PLATELET
BASOS ABS: 0 10*3/uL (ref 0–0.1)
BASOS PCT: 1 %
EOS ABS: 0.1 10*3/uL (ref 0–0.7)
EOS PCT: 1 %
HCT: 36.5 % — ABNORMAL LOW (ref 40.0–52.0)
Hemoglobin: 12.2 g/dL — ABNORMAL LOW (ref 13.0–18.0)
Lymphocytes Relative: 24 %
Lymphs Abs: 2.1 10*3/uL (ref 1.0–3.6)
MCH: 28.3 pg (ref 26.0–34.0)
MCHC: 33.5 g/dL (ref 32.0–36.0)
MCV: 84.3 fL (ref 80.0–100.0)
MONO ABS: 0.7 10*3/uL (ref 0.2–1.0)
Monocytes Relative: 9 %
Neutro Abs: 5.7 10*3/uL (ref 1.4–6.5)
Neutrophils Relative %: 65 %
PLATELETS: 316 10*3/uL (ref 150–440)
RBC: 4.33 MIL/uL — ABNORMAL LOW (ref 4.40–5.90)
RDW: 15.8 % — AB (ref 11.5–14.5)
WBC: 8.7 10*3/uL (ref 3.8–10.6)

## 2015-08-06 LAB — COMPREHENSIVE METABOLIC PANEL
ALT: 25 U/L (ref 17–63)
AST: 17 U/L (ref 15–41)
Albumin: 3.9 g/dL (ref 3.5–5.0)
Alkaline Phosphatase: 169 U/L — ABNORMAL HIGH (ref 38–126)
Anion gap: 8 (ref 5–15)
BILIRUBIN TOTAL: 0.3 mg/dL (ref 0.3–1.2)
BUN: 15 mg/dL (ref 6–20)
CALCIUM: 8.9 mg/dL (ref 8.9–10.3)
CO2: 25 mmol/L (ref 22–32)
CREATININE: 0.87 mg/dL (ref 0.61–1.24)
Chloride: 102 mmol/L (ref 101–111)
Glucose, Bld: 180 mg/dL — ABNORMAL HIGH (ref 65–99)
Potassium: 4 mmol/L (ref 3.5–5.1)
Sodium: 135 mmol/L (ref 135–145)
TOTAL PROTEIN: 7.7 g/dL (ref 6.5–8.1)

## 2015-08-06 LAB — PHOSPHORUS: PHOSPHORUS: 4.5 mg/dL (ref 2.5–4.6)

## 2015-08-06 MED ORDER — SODIUM CHLORIDE 0.9% FLUSH
10.0000 mL | INTRAVENOUS | Status: DC | PRN
  Administered 2015-08-06: 10 mL via INTRAVENOUS
  Filled 2015-08-06: qty 10

## 2015-08-06 MED ORDER — DENOSUMAB 120 MG/1.7ML ~~LOC~~ SOLN
120.0000 mg | Freq: Once | SUBCUTANEOUS | Status: AC
  Administered 2015-08-06: 120 mg via SUBCUTANEOUS
  Filled 2015-08-06: qty 1.7

## 2015-08-06 MED ORDER — HEPARIN SOD (PORK) LOCK FLUSH 100 UNIT/ML IV SOLN
500.0000 [IU] | Freq: Once | INTRAVENOUS | Status: AC
  Administered 2015-08-06: 500 [IU] via INTRAVENOUS

## 2015-08-06 MED ORDER — DEGARELIX ACETATE 80 MG ~~LOC~~ SOLR
80.0000 mg | Freq: Once | SUBCUTANEOUS | Status: AC
  Administered 2015-08-06: 80 mg via SUBCUTANEOUS
  Filled 2015-08-06: qty 4

## 2015-08-06 NOTE — Progress Notes (Signed)
Pt eating and drinking good. Bowels are good.  Pt going to have colonoscopy 08/16/15. At [facility]. Sciatica of right leg and went to ER and gave him prednisone taper and hydrocodone. The nerve pain is better but when he gets up and walks some it starts back hurting and now he takes ES tylenol and it helps the discomfort.  He does have night sweats.

## 2015-08-07 LAB — PSA: PSA: 0.71 ng/mL (ref 0.00–4.00)

## 2015-08-15 NOTE — Progress Notes (Signed)
[name]  [phone] [phone]  ID: Anthony [name] OB: 17-Apr-1956  MR#: [id number]  [id number]  Patient Care Team: Tracie Harrier, MD as PCP - General (Internal Medicine)  CHIEF COMPLAINT:  Chief Complaint  Patient presents with  . Prostate Cancer    INTERVAL HISTORY:  Patient returns to clinic today for continuation of [place]. He has back pain and sciatica which is improved with her prednisone taper and hydrocodone. He continues to have mild discomfort. He admits to occasional night sweats. He has no other neurologic complaints. He denies any chest pain or shortness of breath. He has a good appetite and denies weight loss. He denies any other pain. He denies any nausea, vomiting, constipation, or diarrhea. He has no urinary complaints. Patient offers no further specific complaints today.   REVIEW OF SYSTEMS:   Review of Systems  Constitutional: Positive for malaise/fatigue. Negative for fever and weight loss.  Respiratory: Negative Cardiovascular: Negative.   Gastrointestinal: Negative.   Musculoskeletal: Negative.   Neurological: Negative.     As per HPI. Otherwise, a complete review of systems is negatve.  PAST MEDICAL HISTORY: Past Medical History  Diagnosis Date  . Prostate cancer ([place])   . Anemia   . Heart murmur     PAST SURGICAL HISTORY: Past Surgical History  Procedure Laterality Date  . Hernia repair      FAMILY HISTORY: Reviewed and unchanged. No reported history of malignancy or chronic disease.     ADVANCED DIRECTIVES:    HEALTH MAINTENANCE: Social History  Substance Use Topics  . Smoking status: Former Smoker    Types: Cigarettes    Quit date: 09/25/2013  . Smokeless tobacco: None  . Alcohol Use: No     Colonoscopy:  PAP:  Bone density:  Lipid panel:  No Known Allergies  Current Outpatient Prescriptions  Medication Sig Dispense Refill  . albuterol (PROVENTIL HFA;VENTOLIN  HFA) 108 (90 BASE) MCG/ACT inhaler Inhale 90 mcg into the lungs as needed. 2 puffs by mouth every 6 hours as needed for wheezing    . aspirin 81 MG tablet Take 81 mg by mouth daily. In the morning    . calcium gluconate 500 MG tablet Take 1 tablet (500 mg total) by mouth 3 (three) times daily. 90 tablet 2  . Cholecalciferol (VITAMIN D3) 1000 units CAPS Take 1 capsule by mouth daily.    Marland Kitchen docusate sodium (COLACE) 100 MG capsule Take 100 mg by mouth 3 (three) times daily. As needed for constipation    . ENSURE (ENSURE) Take 1 Can by mouth 2 (two) times daily between meals.     . fentaNYL (DURAGESIC - DOSED MCG/HR) 100 MCG/HR Place 1 patch (100 mcg total) onto the skin every 3 (three) days. 10 patch 0  . gabapentin (NEURONTIN) 300 MG capsule Take 1 capsule (300 mg total) by mouth 3 (three) times daily. At bedtme 90 capsule 1  . HYDROcodone-acetaminophen (NORCO/VICODIN) 5-325 MG tablet Take 1 tablet by mouth every 4 (four) hours as needed for moderate pain. 20 tablet 0  . lidocaine-prilocaine (EMLA) cream     . loratadine (CLARITIN) 10 MG tablet Take 10 mg by mouth daily. 1 tablet orally once a day (only on the day before chemotherapy, the day of chemotherapy and the day after chemotherapy.)    . metoprolol tartrate (LOPRESSOR) 25 MG tablet Take 1 tablet (25 mg total) by mouth daily. 30 tablet 2  . polyethylene glycol (MIRALAX / GLYCOLAX) packet Take 17 g  by mouth daily as needed.     . ranitidine (ZANTAC) 150 MG tablet Take 1 tablet (150 mg total) by mouth 2 (two) times daily. 60 tablet 2  . diphenhydrAMINE (BENADRYL) 25 MG tablet Take 25 mg by mouth every 8 (eight) hours as needed for itching.     No current facility-administered medications for this visit.   Facility-Administered Medications Ordered in Other Visits  Medication Dose Route Frequency Provider Last Rate Last Dose  . sodium chloride 0.9 % injection 10 mL  10 mL Intravenous PRN Lloyd Huger, MD   10 mL at 01/10/15 1508     OBJECTIVE: Filed Vitals:   08/06/15 1400  BP: 135/81  Pulse: 100  Temp: 98.1 F (36.7 C)  Resp: 18     Body mass index is 33.12 kg/(m^2).    ECOG FS:1 - Symptomatic but completely ambulatory  General: Well-developed, well-nourished, obese, no acute distress. African-American male Eyes: anicteric sclera. Lungs: Clear to auscultation bilaterally. Heart: Regular rate and rhythm. No rubs, murmurs, or gallops. Abdomen: Soft, nontender, nondistended. No organomegaly noted, normoactive bowel sounds. Musculoskeletal: No edema, cyanosis, or clubbing. Neuro: Alert, answering all questions appropriately. Cranial nerves grossly intact. Skin: No rashes or petechiae noted. Psych: Normal affect.   LAB RESULTS:  Lab Results  Component Value Date   NA 135 08/06/2015   K 4.0 08/06/2015   CL 102 08/06/2015   CO2 25 08/06/2015   GLUCOSE 180* 08/06/2015   BUN 15 08/06/2015   CREATININE 0.87 08/06/2015   CALCIUM 8.9 08/06/2015   PROT 7.7 08/06/2015   ALBUMIN 3.9 08/06/2015   AST 17 08/06/2015   ALT 25 08/06/2015   ALKPHOS 169* 08/06/2015   BILITOT 0.3 08/06/2015   GFRNONAA >60 08/06/2015   GFRAA >60 08/06/2015    Lab Results  Component Value Date   WBC 8.7 08/06/2015   NEUTROABS 5.7 08/06/2015   HGB 12.2* 08/06/2015   HCT 36.5* 08/06/2015   MCV 84.3 08/06/2015   PLT 316 08/06/2015   Lab Results  Component Value Date   PSA 0.71 08/06/2015    STUDIES: No results found.  ASSESSMENT: Stage IV prostate cancer with diffuse retroperitoneal lymphadenopathy as well as bony metastasis.  PLAN:    1. Prostate cancer: Bone scan results reviewed previously with significant improvement of patient's bone metastasis. He completed chemotherapy with Taxotere on February 28, 2015. Proceed with 80mg  degarelix and 120 mg Xgeva today. Return to clinic in 4 weeks and 8 weeks with repeat laboratory work and continued treatment. If progression is seen on future imaging, can consider cabazitaxel  in the future.  2. Pain: Continue current narcotic regimen.  3. 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 Continue gabapentin as prescribed. 4. Hyperglycemia: Continue current diabetic medications as prescribed.  Patient expressed understanding and was in agreement with this plan. He also understands that He can call clinic at any time with any questions, concerns, or complaints.     Lloyd Huger, MD   08/15/2015 9:04 AM

## 2015-09-03 ENCOUNTER — Inpatient Hospital Stay: Payer: No Typology Code available for payment source

## 2015-09-03 ENCOUNTER — Inpatient Hospital Stay: Payer: No Typology Code available for payment source | Attending: Oncology

## 2015-09-05 ENCOUNTER — Inpatient Hospital Stay: Payer: No Typology Code available for payment source

## 2015-10-01 ENCOUNTER — Inpatient Hospital Stay: Payer: No Typology Code available for payment source

## 2015-10-01 ENCOUNTER — Inpatient Hospital Stay: Payer: No Typology Code available for payment source | Attending: Oncology

## 2015-10-29 ENCOUNTER — Inpatient Hospital Stay: Payer: No Typology Code available for payment source

## 2015-10-29 ENCOUNTER — Inpatient Hospital Stay: Payer: No Typology Code available for payment source | Admitting: Oncology

## 2015-10-29 ENCOUNTER — Inpatient Hospital Stay: Payer: No Typology Code available for payment source | Attending: Oncology

## 2016-04-23 DEATH — deceased

## 2016-08-20 IMAGING — CT CT CHEST-ABD-PELV W/ CM
2 of 5 series · 14 of 46 positions shown, 16 images · IV contrast (agent unspecified)
Comparison: None.

CLINICAL DATA: Abnormal MRI with extensive metastatic bony disease
and lymphadenopathy

EXAM:
CT CHEST, ABDOMEN, AND PELVIS WITH CONTRAST
TECHNIQUE: Multidetector CT imaging of the chest, abdomen and pelvis was
performed following the standard protocol during bolus
administration of intravenous contrast.
CONTRAST:  100 mL Bsovue-MRR

[Series 2: cap with · axial · 0.73mm/px · z∈[-1002,-428]mm · 11 of 131 slices shown, 13 images]
[im 8/131  soft-tissue]
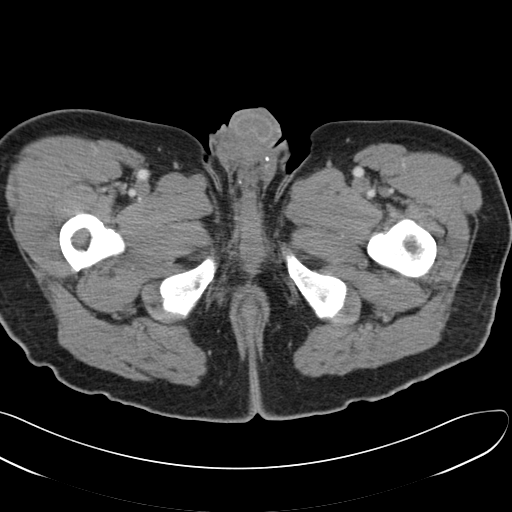
[im 8/131  bone]
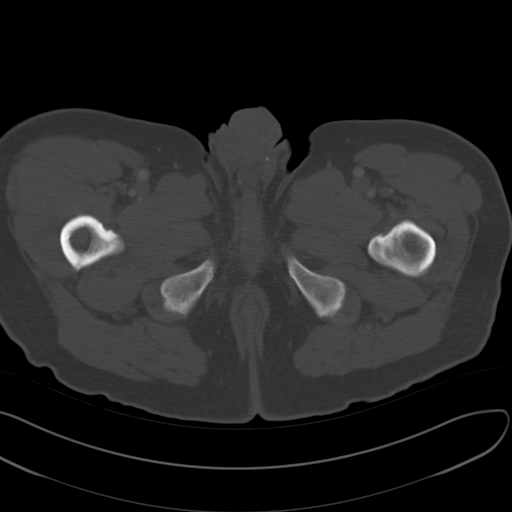
[im 23/131  soft-tissue]
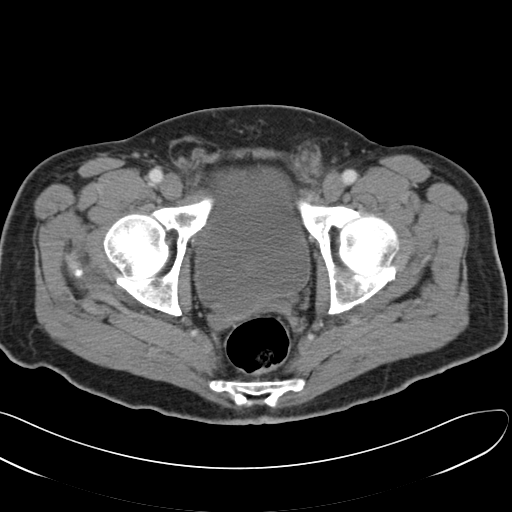
[im 31/131  soft-tissue]
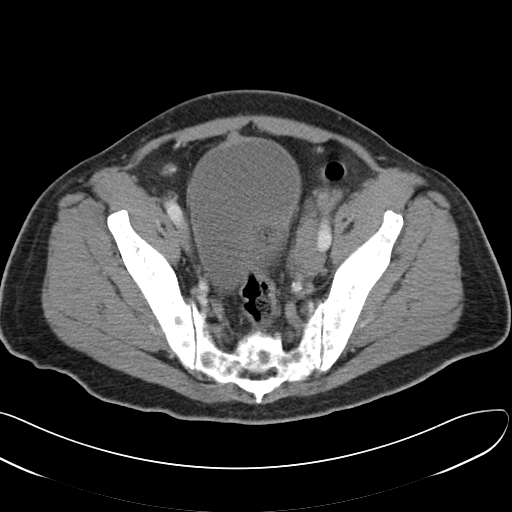
[im 46/131  soft-tissue]
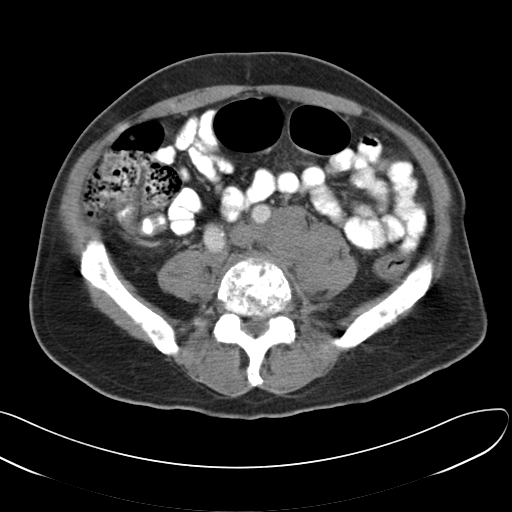
[im 54/131  soft-tissue]
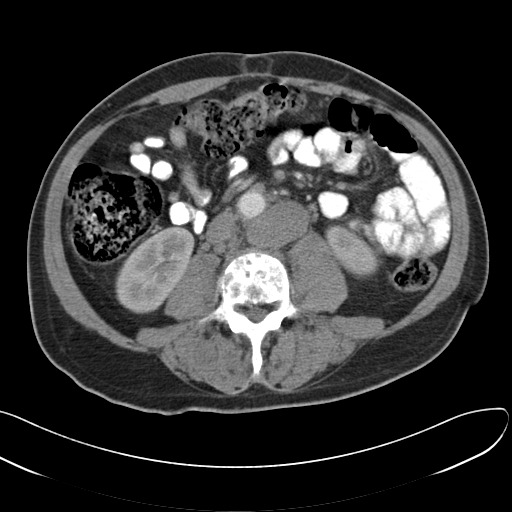
[im 69/131  soft-tissue]
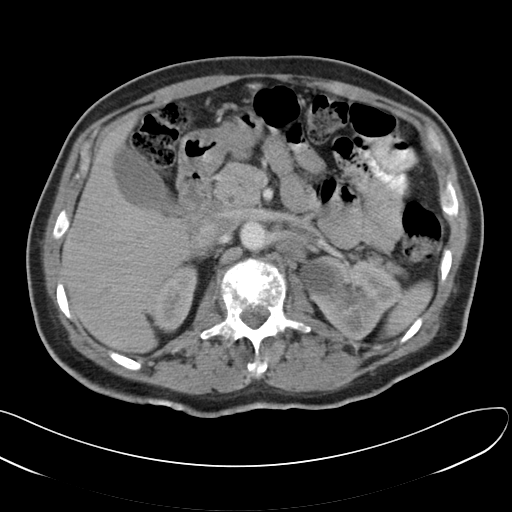
[im 77/131  soft-tissue]
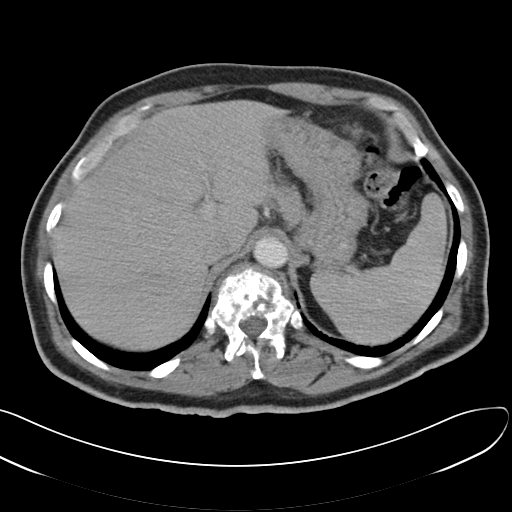
[im 85/131  soft-tissue]
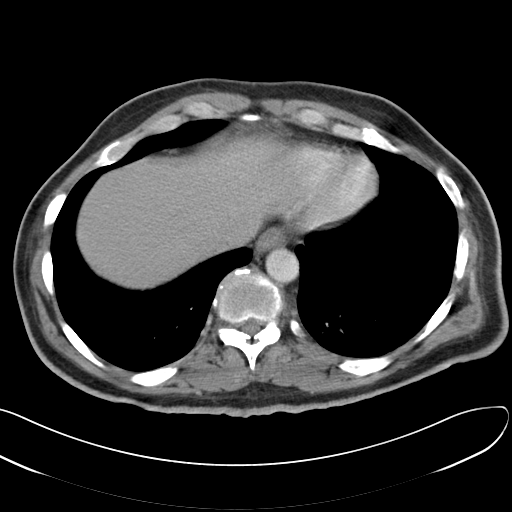
[im 100/131  soft-tissue]
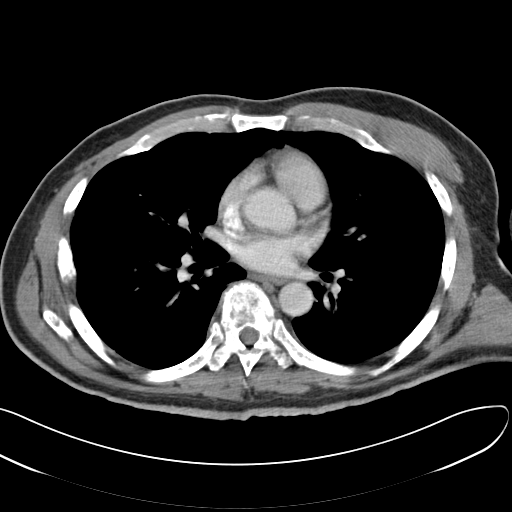
[im 100/131  bone]
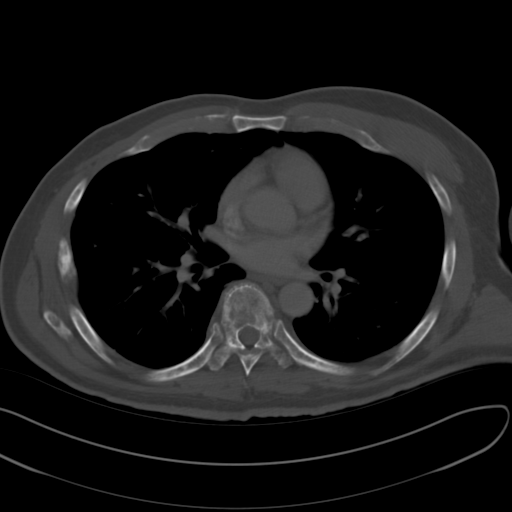
[im 108/131  soft-tissue]
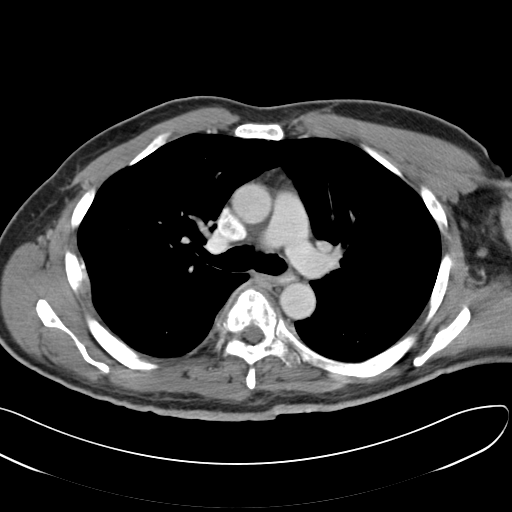
[im 123/131  soft-tissue]
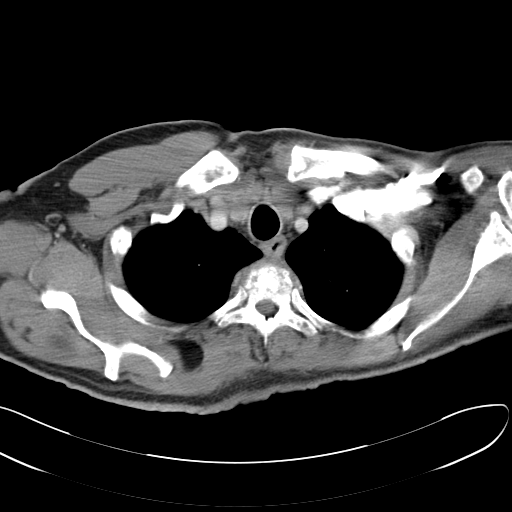

[Series 5: cor cap with · coronal · 0.76mm/px · 3 of 132 slices shown]
[im 44/132  soft-tissue]
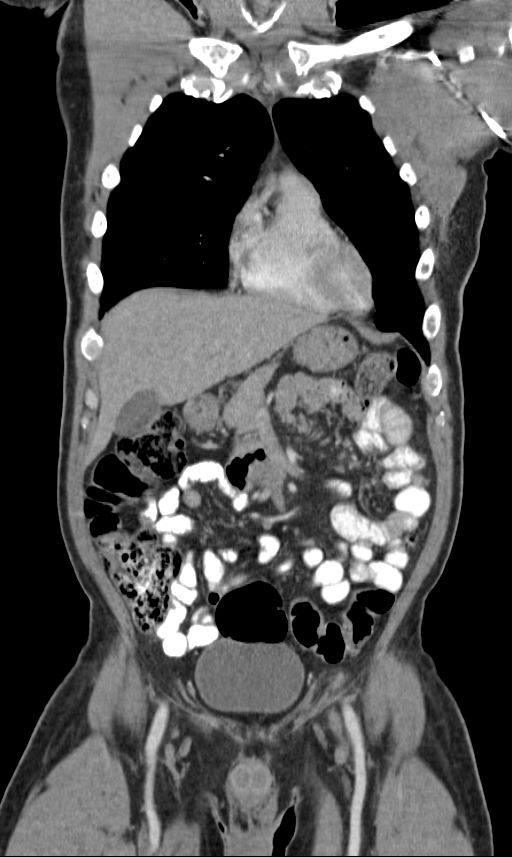
[im 59/132  soft-tissue]
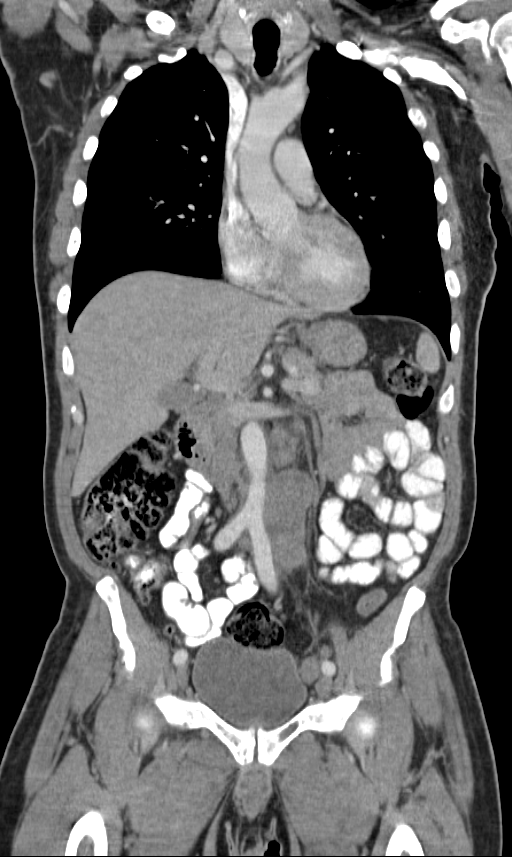
[im 73/132  soft-tissue]
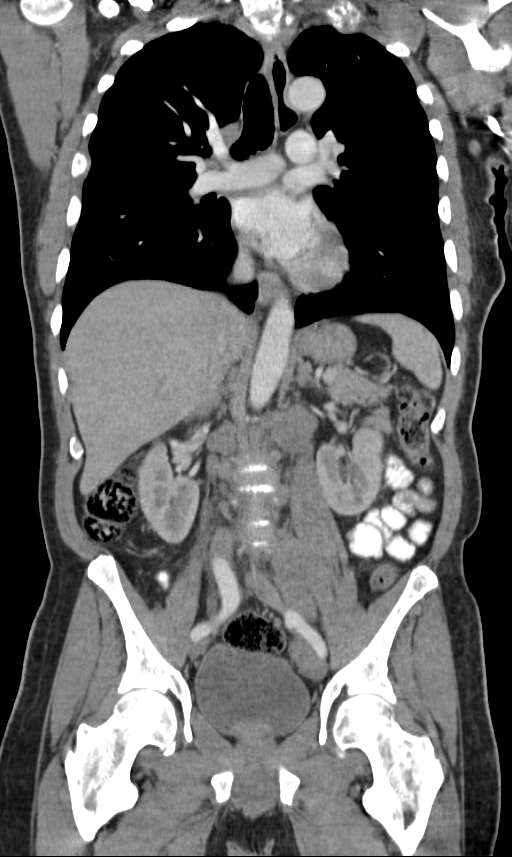

[14 of 46 positions shown; findings below may reference images not displayed]

FINDINGS: CT CHEST FINDINGS

The lungs are well aerated bilaterally without evidence of focal
infiltrate or sizable effusion. No parenchymal nodules are seen. The
thyroid is within normal limits. The thoracic aorta and pulmonary
artery as visualized are unremarkable. No hilar or mediastinal
adenopathy is noted. The osseous structures however show significant
bony metastatic disease similar to that seen on recent MRI of the
lumbar spine.

CT ABDOMEN AND PELVIS FINDINGS

The liver, gallbladder, spleen, adrenal glands and pancreas are all
normal in their CT appearance. The kidneys demonstrate a normal
enhancement pattern with the exception of a few cysts in the left
kidney. The largest of these measures 3.6 cm in greatest dimension.
No renal calculi or urinary tract obstructive changes are noted.

Significant retroperitoneal lymphadenopathy is identified in a
periaortic and inter aortocaval regions. The adenopathy displaces
the aorta anteriorly and measures 4.8 x 3.5 cm in greatest dimension
but extends throughout the entire retroperitoneum and into the left
iliac nodal chain. No significant right-sided adenopathy is noted.

The appendix is well visualized and within normal limits. No focal
colonic abnormality is seen. No obstructive changes are noted. The
bladder is well distended. The prostate indents upon the bladder
mildly. A few small pelvic lymph nodes are identified in the
internal iliac chain. The bony structures again demonstrate diffuse
metastatic disease similar to that seen on the recent MRI
examination. Evaluation of these lesions is that is performed on
that exam.
IMPRESSION: Constellation of findings with bony metastatic disease and
significant left pelvic and retroperitoneal adenopathy suggestive of
prostate carcinoma. Correlation with patient's PSA is recommended.

## 2017-04-03 ENCOUNTER — Other Ambulatory Visit: Payer: Self-pay | Admitting: Nurse Practitioner
